# Patient Record
Sex: Female | Born: 1975
Health system: Southern US, Community
[De-identification: ages and names within clinical notes are randomized; demographics above are authoritative.]

## PROBLEM LIST (undated history)

## (undated) DIAGNOSIS — A048 Other specified bacterial intestinal infections: Secondary | ICD-10-CM

## (undated) HISTORY — DX: Other specified bacterial intestinal infections: A04.8

## (undated) HISTORY — PX: OTHER SURGICAL HISTORY: SHX169

## (undated) HISTORY — PX: INNER EAR SURGERY: SHX679

---

## 2018-06-03 DIAGNOSIS — A048 Other specified bacterial intestinal infections: Secondary | ICD-10-CM

## 2018-06-03 HISTORY — DX: Other specified bacterial intestinal infections: A04.8

## 2018-10-08 ENCOUNTER — Other Ambulatory Visit: Payer: Self-pay | Admitting: Physician Assistant

## 2018-10-08 ENCOUNTER — Other Ambulatory Visit: Payer: Self-pay

## 2018-10-08 ENCOUNTER — Ambulatory Visit (INDEPENDENT_AMBULATORY_CARE_PROVIDER_SITE_OTHER): Payer: PRIVATE HEALTH INSURANCE | Admitting: Physician Assistant

## 2018-10-08 ENCOUNTER — Encounter: Payer: Self-pay | Admitting: Physician Assistant

## 2018-10-08 VITALS — Temp 97.7°F | Wt 147.7 lb

## 2018-10-08 DIAGNOSIS — S63501A Unspecified sprain of right wrist, initial encounter: Secondary | ICD-10-CM | POA: Diagnosis not present

## 2018-10-08 DIAGNOSIS — G5601 Carpal tunnel syndrome, right upper limb: Secondary | ICD-10-CM | POA: Diagnosis not present

## 2018-10-08 MED ORDER — MELOXICAM 15 MG PO TABS: 15.0000 mg | ORAL_TABLET | Freq: Every day | ORAL | 0 refills | Status: DC

## 2018-10-08 NOTE — Progress Notes (Signed)
Virtual Visit via Video Note I connected with Melissa Newman on 10/08/18 at  1:00 PM EDT by a video enabled telemedicine application and verified that I am speaking with the correct person using two identifiers.   I discussed the limitations of evaluation and management by telemedicine and the availability of in person appointments. The patient expressed understanding and agreed to proceed.  History of Present Illness: Patient presents today via WebEx Visit to establish care and with acute concerns. Patient just moved from [place].  Patient endorses 2 months of R hand pain described as aching. Endorses symptoms began after having to pack up and haul moving boxes. Notes a lot of heavy lifting. Wakes up early in the morning from the pain. Notes occasional numbness/tingling in first three fingers of that hand. Denies decreased ROM. Some mild anterior writs pain noted. Denies bruising, redness. Has seen chiropractor for this and has had a few adjustments with some improvement. Denies imaging.   Observations/Objective: Patient is well-developed, well-nourished in no acute distress.  Resting comfortably in chair at home.  Head is normocephalic, atraumatic.  No labored breathing.  Speech is clear and coherent with logical contest.  Patient is alert and oriented at baseline.  + Tinel and Phalen sign on exam with help from patient and spouse.  ROM within normal limits.   Assessment and Plan: 1. Sprain of right wrist, initial encounter 2. Carpal tunnel syndrome, right BRACE given (picked up by husband). Wear nightly and when able during the day. Rx Meloxicam. Tylenol for breakthrough pain. Supportive measures reviewed. Follow-up if not improving over 1-2 weeks.   Follow Up Instructions: Follow-up via MyChart in 1-2 week for reassessment of symptoms.   I discussed the assessment and treatment plan with the patient. The patient was provided an opportunity to ask questions and all were answered. The  patient agreed with the plan and demonstrated an understanding of the instructions.   The patient was advised to call back or seek an in-person evaluation if the symptoms worsen or if the condition fails to improve as anticipated.  Piedad Climes, PA-C

## 2018-10-08 NOTE — Patient Instructions (Addendum)
Please avoid heavy lifting. Wear the brace at night and when possible during the day.  Elevate the wrist and arm while resting. Take the Meloxicam once daily with food. Tylenol for breakthrough pain.  Let me know if symptoms are not improving.    Wrist Sprain, Adult A wrist sprain is a stretch or tear in the strong, fibrous tissues (ligaments) that connect your wrist bones. There are three types of wrist sprains:  Grade 1. In this type of sprain, the ligament is stretched more than normal.  Grade 2. In this type of sprain, the ligament is partially torn. You may be able to move your wrist, but not very much.  Grade 3. In this type of sprain, the ligament or muscle is completely torn. You may find it difficult or extremely painful to move your wrist even a little. What are the causes? A wrist sprain can be caused by using the wrist too much during sports, exercise, or at work. It can also happen with a fall or during an accident. What increases the risk? This condition is more likely to occur in people:  With a previous wrist or arm injury.  With poor wrist strength and flexibility.  Who play contact sports, such as football or soccer.  Who play sports that may result in a fall, such as skateboarding, biking, skiing, or snowboarding.  Who do not exercise regularly.  Who use exercise equipment that does not fit well. What are the signs or symptoms? Symptoms of this condition include:  Pain in the wrist, arm, or hand.  Swelling or bruised skin near the wrist, hand, or arm. The skin may look yellow or kind of blue.  Stiffness or trouble moving the hand.  Hearing a pop or feeling a tear at the time of the injury.  A warm feeling in the skin around the wrist. How is this diagnosed? This condition is diagnosed with a physical exam. Sometimes an X-ray is taken to make sure a bone did not break. If your health care provider thinks that you tore a ligament, he or she may order an  MRI of your wrist. How is this treated? This condition is treated by resting and applying ice to your wrist. Additional treatment may include:  Medicine for pain and inflammation.  A splint to keep your wrist still (immobilized).  Exercises to strengthen and stretch your wrist.  Surgery. This may be done if the ligament is completely torn. Follow these instructions at home: If you have a splint:   Do not put pressure on any part of the splint until it is fully hardened. This may take several hours.  Wear the splint as told by your health care provider. Remove it only as told by your health care provider.  Loosen the splint if your fingers tingle, become numb, or turn cold and blue.  If your splint is not waterproof: ? Do not let it get wet. ? Cover it with a watertight covering when you take a bath or a shower.  Keep the splint clean. Managing pain, stiffness, and swelling   If directed, put ice on the injured area. ? If you have a removable splint, remove it as told by your health care provider. ? Put ice in a plastic bag. ? Place a towel between your skin and the bag or between the splint and the bag. ? Leave the ice on for 20 minutes, 2-3 times per day.  Move your fingers often to avoid stiffness and to  lessen swelling.  Raise (elevate) the injured area above the level of your heart while you are sitting or lying down. Activity  Rest your wrist. Do not do things that cause pain.  Return to your normal activities as told by your health care provider. Ask your health care provider what activities are safe for you.  Do exercises as told by your health care provider. General instructions  Take over-the-counter and prescription medicines only as told by your health care provider.  Do not use any products that contain nicotine or tobacco, such as cigarettes and e-cigarettes. These can delay healing. If you need help quitting, ask your health care provider.  Ask your  health care provider when it is safe to drive if you have a splint.  Keep all follow-up visits as told by your health care provider. This is important. Contact a health care provider if:  Your pain, bruising, or swelling gets worse.  Your skin becomes red, gets a rash, or has open sores.  Your pain does not get better or it gets worse. Get help right away if:  You have a new or sudden sharp pain in the hand, arm, or wrist.  You have tingling or numbness in your hand.  Your fingers turn white, very red, or cold and blue.  You cannot move your fingers. This information is not intended to replace advice given to you by your health care provider. Make sure you discuss any questions you have with your health care provider. Document Released: 02/21/2014 Document Revised: 01/16/2016 Document Reviewed: 01/07/2016 Elsevier Interactive Patient Education  2019 [employer] Inc[employer]   Carpal Tunnel Syndrome  Carpal tunnel syndrome is a condition that causes pain in your hand and arm. The carpal tunnel is a narrow area that is on the palm side of your wrist. Repeated wrist motion or certain diseases may cause swelling in the tunnel. This swelling can pinch the main nerve in the wrist (median nerve). What are the causes? This condition may be caused by:  Repeated wrist motions.  Wrist injuries.  Arthritis.  A sac of fluid (cyst) or abnormal growth (tumor) in the carpal tunnel.  Fluid buildup during pregnancy. Sometimes the cause is not known. What increases the risk? The following factors may make you more likely to develop this condition:  Having a job in which you move your wrist in the same way many times. This includes jobs like being a Midwifebutcher or a Conservation officer, naturecashier.  Being a woman.  Having other health conditions, such as: ? Diabetes. ? Obesity. ? A thyroid gland that is not active enough (hypothyroidism). ? Kidney failure. What are the signs or symptoms? Symptoms of this condition  include:  A tingling feeling in your fingers.  Tingling or a loss of feeling (numbness) in your hand.  Pain in your entire arm. This pain may get worse when you bend your wrist and elbow for a long time.  Pain in your wrist that goes up your arm to your shoulder.  Pain that goes down into your palm or fingers.  A weak feeling in your hands. You may find it hard to grab and hold items. You may feel worse at night. How is this diagnosed? This condition is diagnosed with a medical history and physical exam. You may also have tests, such as:  Electromyogram (EMG). This test checks the signals that the nerves send to the muscles.  Nerve conduction study. This test checks how well signals pass through your nerves.  Imaging  tests, such as X-rays, ultrasound, and MRI. These tests check for what might be the cause of your condition. How is this treated? This condition may be treated with:  Lifestyle changes. You will be asked to stop or change the activity that caused your problem.  Doing exercise and activities that make bones and muscles stronger (physical therapy).  Learning how to use your hand again (occupational therapy).  Medicines for pain and swelling (inflammation). You may have injections in your wrist.  A wrist splint.  Surgery. Follow these instructions at home: If you have a splint:  Wear the splint as told by your doctor. Remove it only as told by your doctor.  Loosen the splint if your fingers: ? Tingle. ? Lose feeling (become numb). ? Turn cold and blue.  Keep the splint clean.  If the splint is not waterproof: ? Do not let it get wet. ? Cover it with a watertight covering when you take a bath or a shower. Managing pain, stiffness, and swelling   If told, put ice on the painful area: ? If you have a removable splint, remove it as told by your doctor. ? Put ice in a plastic bag. ? Place a towel between your skin and the bag. ? Leave the ice on for 20  minutes, 2-3 times per day. General instructions  Take over-the-counter and prescription medicines only as told by your doctor.  Rest your wrist from any activity that may cause pain. If needed, talk with your boss at work about changes that can help your wrist heal.  Do any exercises as told by your doctor, physical therapist, or occupational therapist.  Keep all follow-up visits as told by your doctor. This is important. Contact a doctor if:  You have new symptoms.  Medicine does not help your pain.  Your symptoms get worse. Get help right away if:  You have very bad numbness or tingling in your wrist or hand. Summary  Carpal tunnel syndrome is a condition that causes pain in your hand and arm.  It is often caused by repeated wrist motions.  Lifestyle changes and medicines are used to treat this problem. Surgery may help in very bad cases.  Follow your doctor's instructions about wearing a splint, resting your wrist, keeping follow-up visits, and calling for help. This information is not intended to replace advice given to you by your health care provider. Make sure you discuss any questions you have with your health care provider. Document Released: 06/09/2011 Document Revised: 10/27/2017 Document Reviewed: 10/27/2017 Elsevier Interactive Patient Education  2019 [employer].

## 2018-10-08 NOTE — Progress Notes (Signed)
I have discussed the procedure for the virtual visit with the patient who has given consent to proceed with assessment and treatment.   Melissa Newman S Melissa Newman, CMA     

## 2018-10-27 ENCOUNTER — Encounter: Payer: Self-pay | Admitting: Physician Assistant

## 2018-11-09 ENCOUNTER — Encounter: Payer: Self-pay | Admitting: Physician Assistant

## 2018-11-09 ENCOUNTER — Ambulatory Visit (INDEPENDENT_AMBULATORY_CARE_PROVIDER_SITE_OTHER): Payer: PRIVATE HEALTH INSURANCE | Admitting: Physician Assistant

## 2018-11-09 ENCOUNTER — Other Ambulatory Visit: Payer: Self-pay

## 2018-11-09 DIAGNOSIS — L280 Lichen simplex chronicus: Secondary | ICD-10-CM

## 2018-11-09 DIAGNOSIS — B354 Tinea corporis: Secondary | ICD-10-CM

## 2018-11-09 MED ORDER — CLOTRIMAZOLE-BETAMETHASONE 1-0.05 % EX CREA: 1.0000 "application " | TOPICAL_CREAM | Freq: Two times a day (BID) | CUTANEOUS | 0 refills | Status: DC

## 2018-11-09 NOTE — Patient Instructions (Signed)
Please keep skin clean and dry. Apply a moisturizing lotion to areas twice daily (Eucerin, Cetaphil, etc). Use the prescription cream twice daily for 2 weeks. Follow-up with me at that time for reassessment (in-office). Return sooner if you note any worsening symptoms.    Neurodermatitis Neurodermatitis is an inflammation and thickening of the skin. It is caused by severe itchiness, which leads to repeated scratching and rubbing of the skin. It can happen anywhere on the body. Common places include the neck, head, arms, and legs. Neurodermatitis may have mental or emotional (psychogenic) causes that may need treatment. Usually, this is a lifelong (chronic) condition, but in some cases it may go away on its own. What are the causes? This condition is caused by repeated scratching and rubbing of the skin. This happens because of feelings of severe itchiness. Often, the cause of itchiness is not known. Common causes include:  Materials or substances that irritate the skin.  Skin conditions, such as chronic dermatitis or eczema.  Allergic reaction. In some cases, neurodermatitis may have psychogenic causes, like anxiety or stress, that may need to be treated. What increases the risk? You are more likely to develop this condition if:  You have dry skin or other skin conditions.  You have an anxiety disorder or a lot of stress.  You are 60-3 years old.  You are a woman.  You are exposed to irritating chemicals. What are the signs or symptoms? The main symptom of this condition is one or more patches of skin that are red, swollen, itchy, and thicker than normal.  These patches can be anywhere on the body, but are often on the head, neck, legs, and arms.  This condition can also affect the genital and anal areas.  In severe cases, bleeding, crusting, and scaling of the skin can occur. Symptoms often come and go over time. The frequency and severity of symptoms varies from person to  person. How is this diagnosed? This condition is diagnosed based on your medical history and a physical exam of your skin. You may be referred to a health care provider who specializes in skin care (dermatologist). You may also have tests, including:  Skin allergy tests. These tests will determine if you have an allergy that is causing your condition.  Blood or other lab tests. These tests can help to determine if your itching is caused by an infection or other condition. In some cases, your health care provider may remove a small amount of skin cells to be examined under a microscope (biopsy). How is this treated? This condition is managed by stopping all scratching and rubbing of the affected area. It is also important to remove all skin irritants and treat any underlying causes of neurodermatitis. Depending on the cause, your health care provider may recommend certain treatments such as:  Creams, lotions, or pills to reduce inflammation and itching (corticosteroids).  Medicines to prevent or treat infection (antibiotics).  Medicines to relieve allergy symptoms (antihistamines).  Therapy to learn how to stop feelings of itchiness and stop scratching (behavioral therapy or psychotherapy). Your health care provider may recommend a doctor who specializes in human behavior (psychologist).  Phototherapy. This condition sometimes goes away without treatment. Follow these instructions at home: Skin care   Avoid scratching and rubbing your skin. This is the best way to manage neurodermatitis and prevent it from returning.  Keep your skin clean and moisturized. ? Avoid very hot water. ? Apply lotion at least one time per day. ?  Avoid products, such as soaps and lotions, that have harsh chemicals, scents, and dyes. ? Try to shower and take baths only as often as you need to. Frequent bathing can dry out your skin.  Avoid tight or rough clothing that irritates your skin.  Apply cool  washcloths to your skin to help reduce itching. General instructions  Use over-the-counter and prescription medicines only as told by your health care provider.  Drink enough fluid to keep your urine pale yellow.  Avoid irritating chemicals.  Pay attention to your symptoms. Watch for things that trigger itching and scratching.  Keep your fingernails cut short to reduce injury from scratching.  Keep all follow-up visits as told by your health care provider. This is important. Contact a health care provider if:  Your condition gets worse or does not get better after 3-4 days of treatment.  You have blood or fluid leaking from an irritated patch of skin.  You have a fever. Summary  Neurodermatitis is an inflammation and thickening of the skin. It is caused by severe itchiness, which leads to repeated scratching and rubbing of the skin.  Depending on the cause, your health care provider may recommend certain treatments such as creams, lotions, and pills to reduce inflammation and other medicines to treat infection, if needed.  Treatment may also include therapy to learn how to stop feelings of itchiness and stop scratching (behavioral therapy or psychotherapy).  Use over-the-counter and prescription medicines only as told by your health care provider.  Keep all follow-up visits as told by your health care provider. This is important. This information is not intended to replace advice given to you by your health care provider. Make sure you discuss any questions you have with your health care provider. Document Released: 07/28/2004 Document Revised: 11/08/2017 Document Reviewed: 11/08/2017 Elsevier Interactive Patient Education  2019 [employer]

## 2018-11-09 NOTE — Progress Notes (Signed)
I have discussed the procedure for the virtual visit with the patient who has given consent to proceed with assessment and treatment.   Britny Riel S Luciano Cinquemani, CMA     

## 2018-11-09 NOTE — Progress Notes (Signed)
   Virtual Visit via Video   I connected with patient on 11/09/18 at  2:40 PM EDT by a video enabled telemedicine application and verified that I am speaking with the correct person using two identifiers.  Location patient: Home Location provider: [facility] Persons participating in the virtual visit: Patient, Provider, CMA (Patina Moore)  I discussed the limitations of evaluation and management by telemedicine and the availability of in person appointments. The patient expressed understanding and agreed to proceed.  Subjective:   HPI:   Patient presents via [facility] today c/o pruritic lesions of arms and neck over the past few months. Denies recent travel or sick contact. Denies dry skin or hx of eczema. Notes the area on her neck is extremely itchy, sometimes waking her from sleep. Topical hydrocortisone cream helps but the area comes back within a few days.  ROS:   See pertinent positives and negatives per HPI.  There are no active problems to display for this patient.   Social History   Tobacco Use  . Smoking status: Never Smoker  . Smokeless tobacco: Never Used  Substance Use Topics  . Alcohol use: Never    Frequency: Never    Current Outpatient Medications:  Marland Kitchen  Multiple Vitamins-Minerals (MULTIVITAMIN WITH MINERALS) tablet, Take 1 tablet by mouth daily., Disp: , Rfl:  .  clotrimazole-betamethasone (LOTRISONE) cream, Apply 1 application topically 2 (two) times daily., Disp: 30 g, Rfl: 0  No Known Allergies  Objective:   There were no vitals taken for this visit.  Patient is well-developed, well-nourished in no acute distress.  Resting comfortably at home.  Head is normocephalic, atraumatic.  No labored breathing.  Speech is clear and coherent with logical content.  Patient is alert and oriented at baseline.  Patient with hyperpigmented, slightly erythematous annular lesion of antecubital fossa of R arm with scaling. Similar lesions of R neck  noted with overlying lichenified hyperpigmented skin. This area is very pruritic per patient.   Assessment and Plan:   1. Tinea corporis 2. Lichen simplex chronicus Mild area of tinea corporis on the arm and likely on the neck as well. Neck lesion is covered by patch of what appears to be lichen simplex chronicus. Will start Lotrisone cream twice daily x 2 weeks to areas of concern. Supportive measures reviewed. Follow-up in office in 2 weeks for reassessment if not resolved.    Piedad Climes, PA-C 11/09/2018

## 2018-12-04 ENCOUNTER — Other Ambulatory Visit: Payer: Self-pay | Admitting: Physician Assistant

## 2018-12-04 DIAGNOSIS — G5601 Carpal tunnel syndrome, right upper limb: Secondary | ICD-10-CM

## 2018-12-04 DIAGNOSIS — G8929 Other chronic pain: Secondary | ICD-10-CM

## 2018-12-21 ENCOUNTER — Encounter: Payer: Self-pay | Admitting: Physician Assistant

## 2018-12-23 NOTE — Telephone Encounter (Signed)
Please call patient to schedule nurse visit for 2nd MMR and Varicella. Thank you.

## 2018-12-24 ENCOUNTER — Ambulatory Visit (INDEPENDENT_AMBULATORY_CARE_PROVIDER_SITE_OTHER): Payer: PRIVATE HEALTH INSURANCE

## 2018-12-24 ENCOUNTER — Ambulatory Visit: Payer: PRIVATE HEALTH INSURANCE

## 2018-12-24 ENCOUNTER — Other Ambulatory Visit: Payer: Self-pay

## 2018-12-24 DIAGNOSIS — Z23 Encounter for immunization: Secondary | ICD-10-CM | POA: Diagnosis not present

## 2018-12-25 ENCOUNTER — Ambulatory Visit: Payer: PRIVATE HEALTH INSURANCE

## 2019-02-08 ENCOUNTER — Other Ambulatory Visit: Payer: Self-pay

## 2019-02-08 ENCOUNTER — Encounter: Payer: Self-pay | Admitting: Physician Assistant

## 2019-02-08 ENCOUNTER — Ambulatory Visit (INDEPENDENT_AMBULATORY_CARE_PROVIDER_SITE_OTHER): Payer: PRIVATE HEALTH INSURANCE | Admitting: Physician Assistant

## 2019-02-08 VITALS — BP 100/60 | HR 80 | Temp 98.4°F | Resp 14 | Ht 64.0 in | Wt 146.0 lb

## 2019-02-08 DIAGNOSIS — R350 Frequency of micturition: Secondary | ICD-10-CM | POA: Diagnosis not present

## 2019-02-08 DIAGNOSIS — N3001 Acute cystitis with hematuria: Secondary | ICD-10-CM

## 2019-02-08 LAB — POCT URINALYSIS DIPSTICK
Bilirubin, UA: NEGATIVE
Glucose, UA: NEGATIVE
Ketones, UA: NEGATIVE
Nitrite, UA: NEGATIVE
Protein, UA: POSITIVE — AB
Spec Grav, UA: 1.01 (ref 1.010–1.025)
Urobilinogen, UA: 0.2 E.U./dL
pH, UA: 7 (ref 5.0–8.0)

## 2019-02-08 MED ORDER — CEPHALEXIN 500 MG PO CAPS: 500.0000 mg | ORAL_CAPSULE | Freq: Two times a day (BID) | ORAL | 0 refills | Status: DC

## 2019-02-08 NOTE — Patient Instructions (Signed)
Your symptoms are consistent with a bladder infection, also called acute cystitis. Please take your antibiotic (Keflex) as directed until all pills are gone.  Stay very well hydrated.  Consider a daily probiotic (Align, Culturelle, or Activia) to help prevent stomach upset caused by the antibiotic.  Taking a probiotic daily may also help prevent recurrent UTIs.  Also consider taking AZO (Phenazopyridine) tablets to help decrease pain with urination.  I will call you with your urine testing results.  We will change antibiotics if indicated.  Call or return to clinic if symptoms are not resolved by completion of antibiotic.   Urinary Tract Infection A urinary tract infection (UTI) can occur any place along the urinary tract. The tract includes the kidneys, ureters, bladder, and urethra. A type of germ called bacteria often causes a UTI. UTIs are often helped with antibiotic medicine.  HOME CARE   If given, take antibiotics as told by your doctor. Finish them even if you start to feel better.  Drink enough fluids to keep your pee (urine) clear or pale yellow.  Avoid tea, drinks with caffeine, and bubbly (carbonated) drinks.  Pee often. Avoid holding your pee in for a long time.  Pee before and after having sex (intercourse).  Wipe from front to back after you poop (bowel movement) if you are a woman. Use each tissue only once. GET HELP RIGHT AWAY IF:   You have back pain.  You have lower belly (abdominal) pain.  You have chills.  You feel sick to your stomach (nauseous).  You throw up (vomit).  Your burning or discomfort with peeing does not go away.  You have a fever.  Your symptoms are not better in 3 days. MAKE SURE YOU:   Understand these instructions.  Will watch your condition.  Will get help right away if you are not doing well or get worse. Document Released: 12/07/2007 Document Revised: 03/14/2012 Document Reviewed: 01/19/2012 Exit[employer] Patient Information 2015  [employer]. This information is not intended to replace advice given to you by your health care provider. Make sure you discuss any questions you have with your health care provider.   

## 2019-02-08 NOTE — Progress Notes (Signed)
BVQ:XIHWTU, Melissa Cole, PA-C Chief Complaint  Patient presents with  . Urinary Tract Infection    Started 1 day ago. Urgency, low back pain, hematuria, dysuria. Increased fluids, cranberry juice, and Tylenol for pain.     Current Issues:  Presents with 1 days of dysuria, urinary urgency and urinary frequency Associated symptoms include:  urinary hesitancy and hematuria, low back pain. Denies flank pain, fever, chills, nausea or vomiting. LMP was 2 weeks ago. Denies vaginal symptoms..  There is a previous history of of similar symptoms. Sexually active:  Yes with female.  - husband No concern for STI.  Prior to Admission medications   Medication Sig Start Date End Date Taking? Authorizing Provider  Multiple Vitamins-Minerals (MULTIVITAMIN WITH MINERALS) tablet Take 1 tablet by mouth daily.   Yes [provider]    Review of Systems:Pertinent ROS are listed in the HPI  PE:  BP 100/60   Pulse 80   Temp 98.4 F (36.9 C) (Skin)   Resp 14   Ht 5\' 4"  (1.626 m)   Wt 146 lb (66.2 kg)   SpO2 98%   BMI 25.06 kg/m    Physical Exam  Constitutional: She is oriented to person, place, and time and well-developed, well-nourished, and in no distress.  HENT:  Head: Normocephalic and atraumatic.  Eyes: Conjunctivae are normal.  Neck: Neck supple.  Cardiovascular: Normal rate, regular rhythm, normal heart sounds and intact distal pulses.  Pulmonary/Chest: Effort normal and breath sounds normal. No respiratory distress. She has no wheezes. She has no rales. She exhibits no tenderness.  Abdominal: There is no abdominal tenderness. There is no CVA tenderness.  Neurological: She is alert and oriented to person, place, and time.  Psychiatric: Affect normal.  Vitals reviewed.  No results found for this or any previous visit.  Assessment and Plan:  1. Urinary frequency Urine dip + blood and large LE. Classic UTI symptoms. Will start Keflex empirically. Urine culture sent. Supportive  measures and OTC medications reviewed with patient. Will alter treatment based on culture sensitivities if change is indicated.  - POCT Urinalysis Dipstick

## 2019-02-10 LAB — URINE CULTURE
MICRO NUMBER:: 749235
SPECIMEN QUALITY:: ADEQUATE

## 2019-02-11 ENCOUNTER — Encounter: Payer: Self-pay | Admitting: Physician Assistant

## 2019-02-14 ENCOUNTER — Telehealth: Payer: Self-pay | Admitting: Emergency Medicine

## 2019-02-14 ENCOUNTER — Other Ambulatory Visit: Payer: Self-pay | Admitting: Physician Assistant

## 2019-02-14 DIAGNOSIS — N3001 Acute cystitis with hematuria: Secondary | ICD-10-CM

## 2019-02-14 DIAGNOSIS — N949 Unspecified condition associated with female genital organs and menstrual cycle: Secondary | ICD-10-CM

## 2019-02-14 MED ORDER — NITROFURANTOIN MONOHYD MACRO 100 MG PO CAPS: 100.0000 mg | ORAL_CAPSULE | Freq: Two times a day (BID) | ORAL | 0 refills | Status: DC

## 2019-02-14 NOTE — Telephone Encounter (Signed)
Advised patient on the phone. She is finishing up her Keflex today. Patient states her symptoms are worst. She is uncomfortable and wants a referral to GYN. PCP agreeable. Referral placed.   No need for repeat appointment. Reviewed culture results. Stop Keflex. Start Macrobid 100 mg BID x 5 days. I have sent in Rx.   ----- Message -----  From: Leonidas Romberg, CMA  Sent: 02/14/2019 11:26 AM EDT  To: [name], PA-C  Subject: FW: Appointment Request                 ----- Message -----  From: Wynell Balloon  Sent: 02/14/2019  7:51 AM EDT  To: Leonidas Romberg, CMA  Subject: FW: Appointment Request               Please advise if pt needs to come in for another appt? Pt is still complaining of UTI symptoms.   ----- Message -----  From: Melissa Newman  Sent: 02/13/2019 10:01 PM EDT  To: [facility]  Subject: Appointment Request                 Appointment Request From: Melissa Newman    With Provider: Caralyn Guile [facility]]    Preferred Date Range: 02/15/2019 - 02/15/2019    Preferred Times: Friday Afternoon    Reason for visit: Request an Appointment    Comments:  Same as last appointment

## 2019-02-22 ENCOUNTER — Other Ambulatory Visit: Payer: Self-pay

## 2019-02-25 ENCOUNTER — Ambulatory Visit (INDEPENDENT_AMBULATORY_CARE_PROVIDER_SITE_OTHER): Payer: PRIVATE HEALTH INSURANCE | Admitting: Obstetrics & Gynecology

## 2019-02-25 ENCOUNTER — Encounter: Payer: Self-pay | Admitting: Obstetrics & Gynecology

## 2019-02-25 ENCOUNTER — Other Ambulatory Visit: Payer: Self-pay

## 2019-02-25 VITALS — BP 122/78 | Ht 65.0 in | Wt 152.0 lb

## 2019-02-25 DIAGNOSIS — Z01419 Encounter for gynecological examination (general) (routine) without abnormal findings: Secondary | ICD-10-CM

## 2019-02-25 DIAGNOSIS — Z9189 Other specified personal risk factors, not elsewhere classified: Secondary | ICD-10-CM | POA: Diagnosis not present

## 2019-02-25 DIAGNOSIS — Z1151 Encounter for screening for human papillomavirus (HPV): Secondary | ICD-10-CM

## 2019-02-25 DIAGNOSIS — Z113 Encounter for screening for infections with a predominantly sexual mode of transmission: Secondary | ICD-10-CM | POA: Diagnosis not present

## 2019-02-25 NOTE — Progress Notes (Signed)
Brandon Melnickrene Arnell September 21, 1975 [id number]   History:    43 y.o. G1P1L1  Married.  Vasectomy.  Son is 734 yo.  RP:  New patient presenting for annual gyn exam   HPI: Normal menstrual periods every month.  No breakthrough bleeding.  No pelvic pain.  No pain with intercourse.  Husband with vasectomy.  Breasts normal.  Body mass index 25.29.  Patient just resumed physical activities.  Health labs with family physician.  Past medical history,surgical history, family history and social history were all reviewed and documented in the EPIC chart.  Gynecologic History Patient's last menstrual period was 01/27/2019. Contraception: vasectomy Last Pap: 03/2018. Results were: normal per patient Last mammogram: Never, [facility] Bone Density: Never Colonoscopy: Never  Obstetric History OB History  Gravida Para Term Preterm AB Living  1 1       1   SAB TAB Ectopic Multiple Live Births               # Outcome Date GA Lbr Len/2nd Weight Sex Delivery Anes PTL Lv  1 Para              ROS: A ROS was performed and pertinent positives and negatives are included in the history.  GENERAL: No fevers or chills. HEENT: No change in vision, no earache, sore throat or sinus congestion. NECK: No pain or stiffness. CARDIOVASCULAR: No chest pain or pressure. No palpitations. PULMONARY: No shortness of breath, cough or wheeze. GASTROINTESTINAL: No abdominal pain, nausea, vomiting or diarrhea, melena or bright red blood per rectum. GENITOURINARY: No urinary frequency, urgency, hesitancy or dysuria. MUSCULOSKELETAL: No joint or muscle pain, no back pain, no recent trauma. DERMATOLOGIC: No rash, no itching, no lesions. ENDOCRINE: No polyuria, polydipsia, no heat or cold intolerance. No recent change in weight. HEMATOLOGICAL: No anemia or easy bruising or bleeding. NEUROLOGIC: No headache, seizures, numbness, tingling or weakness. PSYCHIATRIC: No depression, no loss of interest in normal activity  or change in sleep pattern.     Exam:   BP 122/78   Ht 5\' 5"  (1.651 m)   Wt 152 lb (68.9 kg)   LMP 01/27/2019   BMI 25.29 kg/m   Body mass index is 25.29 kg/m.  General appearance : Well developed well nourished female. No acute distress HEENT: Eyes: no retinal hemorrhage or exudates,  Neck supple, trachea midline, no carotid bruits, no thyroidmegaly Lungs: Clear to auscultation, no rhonchi or wheezes, or rib retractions  Heart: Regular rate and rhythm, no murmurs or gallops Breast:Examined in sitting and supine position were symmetrical in appearance, no palpable masses or tenderness,  no skin retraction, no nipple inversion, no nipple discharge, no skin discoloration, no axillary or supraclavicular lymphadenopathy Abdomen: no palpable masses or tenderness, no rebound or guarding Extremities: no edema or skin discoloration or tenderness  Pelvic: Vulva: Normal             Vagina: No gross lesions or discharge  Cervix: No gross lesions or discharge.  Pap/HPV HR, Gono-Chlam done.  Uterus  AV, normal size, shape and consistency, non-tender and mobile  Adnexa  Without masses or tenderness  Anus: Normal   Assessment/Plan:  43 y.o. female for annual exam   1. Encounter for routine gynecological examination with Papanicolaou smear of cervix Normal gynecologic exam.  Pap with high-risk HPV done today.  Breast exam normal.  Will schedule screening mammogram at the breast center now.  Health labs with family physician.  Good body mass index  at 25.29.  Recommend aerobic physical activities 5 times a week and weightlifting every 2 days.  Continue with healthy nutrition.  2. Relies on partner vasectomy for contraception  3. Screen for STD (sexually transmitted disease) - HIV antibody (with reflex) - RPR - Hepatitis C Antibody - Hepatitis B Surface AntiGEN -Gonorrhea and chlamydia on Pap  Other orders - Probiotic Product (PROBIOTIC-10 PO); Take by mouth. - diphenhydrAMINE (BENADRYL)  25 mg capsule; Take 25 mg by mouth every 6 (six) hours as needed.  Princess Bruins MD, 9:42 AM 02/25/2019

## 2019-02-25 NOTE — Patient Instructions (Signed)
1. Encounter for routine gynecological examination with Papanicolaou smear of cervix Normal gynecologic exam.  Pap with high-risk HPV done today.  Breast exam normal.  Will schedule screening mammogram at the breast center now.  Health labs with family physician.  Good body mass index at 25.29.  Recommend aerobic physical activities 5 times a week and weightlifting every 2 days.  Continue with healthy nutrition.  2. Relies on partner vasectomy for contraception  3. Screen for STD (sexually transmitted disease) - HIV antibody (with reflex) - RPR - Hepatitis C Antibody - Hepatitis B Surface AntiGEN -Gonorrhea and chlamydia on Pap  Other orders - Probiotic Product (PROBIOTIC-10 PO); Take by mouth. - diphenhydrAMINE (BENADRYL) 25 mg capsule; Take 25 mg by mouth every 6 (six) hours as needed.  Melissa Newman, it was a pleasure seeing you today!  I will inform you of your results as soon as they are available.

## 2019-02-25 NOTE — Addendum Note (Signed)
Addended by: Thurnell Garbe A on: 02/25/2019 11:26 AM   Modules accepted: Orders

## 2019-02-26 LAB — PAP IG, CT-NG NAA, HPV HIGH-RISK
C. trachomatis RNA, TMA: NOT DETECTED
HPV DNA High Risk: NOT DETECTED
N. gonorrhoeae RNA, TMA: NOT DETECTED

## 2019-02-27 LAB — HEPATITIS C ANTIBODY
Hepatitis C Ab: NONREACTIVE
SIGNAL TO CUT-OFF: 0.06 (ref <1.00)

## 2019-02-27 LAB — FLUORESCENT TREPONEMAL AB(FTA)-IGG-BLD: Fluorescent Treponemal ABS: NONREACTIVE

## 2019-02-27 LAB — HEPATITIS B SURFACE ANTIGEN: Hepatitis B Surface Ag: NONREACTIVE

## 2019-02-27 LAB — RPR TITER: RPR Titer: 1:1 {titer} — ABNORMAL HIGH

## 2019-02-27 LAB — HIV ANTIBODY (ROUTINE TESTING W REFLEX): HIV 1&2 Ab, 4th Generation: NONREACTIVE

## 2019-02-27 LAB — RPR: RPR Ser Ql: REACTIVE — AB

## 2019-03-13 ENCOUNTER — Other Ambulatory Visit: Payer: Self-pay

## 2019-03-13 ENCOUNTER — Ambulatory Visit (INDEPENDENT_AMBULATORY_CARE_PROVIDER_SITE_OTHER): Payer: PRIVATE HEALTH INSURANCE

## 2019-03-13 ENCOUNTER — Encounter: Payer: Self-pay | Admitting: Physician Assistant

## 2019-03-13 DIAGNOSIS — Z23 Encounter for immunization: Secondary | ICD-10-CM | POA: Diagnosis not present

## 2019-04-03 ENCOUNTER — Ambulatory Visit: Payer: PRIVATE HEALTH INSURANCE | Admitting: Physician Assistant

## 2019-11-28 ENCOUNTER — Ambulatory Visit (INDEPENDENT_AMBULATORY_CARE_PROVIDER_SITE_OTHER): Payer: 59 | Admitting: Family Medicine

## 2019-11-28 ENCOUNTER — Encounter: Payer: Self-pay | Admitting: Family Medicine

## 2019-11-28 ENCOUNTER — Other Ambulatory Visit: Payer: Self-pay

## 2019-11-28 VITALS — BP 116/68 | HR 76 | Temp 98.0°F | Resp 16 | Ht 65.0 in | Wt 143.2 lb

## 2019-11-28 DIAGNOSIS — M0609 Rheumatoid arthritis without rheumatoid factor, multiple sites: Secondary | ICD-10-CM

## 2019-11-28 MED ORDER — PREDNISONE 10 MG PO TABS
ORAL_TABLET | ORAL | 0 refills | Status: DC
Start: 2019-11-28 — End: 2020-04-16

## 2019-11-28 NOTE — Progress Notes (Signed)
   Subjective:    Patient ID: Melissa Newman, female    DOB: October 24, 1975, 44 y.o.   MRN: [id number]  HPI Hand and shoulder pain- R wrist pain, L 4th finger PIP joint swelling.  Went to Ortho last year and was injected w/ steroid which helped for ~2 weeks.  Did PT w/o relief.  January 2021 went to Rheumatology and was told she had RA and possibly psoriatic arthritis.  Was told to take Methotrexate but she did not.  Rheumatology wanted her to f/u in 6 weeks but she did not.  She decided to change diet to whole food plant based diet.  Is taking Glucosamine w/ some relief.  R shoulder pain new as of 3 weeks ago.  No known injury.  No change in activity level.  Husband reports they have 'done a lot of research' and didn't feel that a 'temporary fix' was the right approach.   Review of Systems For ROS see HPI   This visit occurred during the SARS-CoV-2 public health emergency.  Safety protocols were in place, including screening questions prior to the visit, additional usage of staff PPE, and extensive cleaning of exam room while observing appropriate contact time as indicated for disinfecting solutions.       Objective:   Physical Exam Vitals reviewed.  Constitutional:      General: She is not in acute distress.    Appearance: Normal appearance. She is not ill-appearing.  HENT:     Head: Normocephalic and atraumatic.  Musculoskeletal:        General: Deformity (L 4th finger PIP joint deformity, R wrist fixed deformity) present.     Comments: No obvious deformity of R shoulder  Neurological:     General: No focal deficit present.     Mental Status: She is alert and oriented to person, place, and time.  Psychiatric:        Mood and Affect: Mood normal.        Behavior: Behavior normal.           Assessment & Plan:  Rheumatoid Arthritis- New.  Had long discussion w/ both pt and husband that while a plant based, whole food diet was great, it was not going to cure or prevent the  progression of RA.  Explained that RA is an autoimmune process and that her body would continue to attack her joints, with worsening pain, limited mobility, and eventual joint destruction.  I told them that medication was not a 'temporary fix' but rather used to treat inflammation and suppress the immune system attack on the joints.  Discussed that Prednisone was used as a temporary fix or bridge to improve initial pain and inflammation while initiating treatment.  Will start Pred taper and refer back to Rheumatology as pt reports she would like to start over w/ new Rheumatologist.

## 2019-11-28 NOTE — Patient Instructions (Signed)
Follow up with Melissa Newman as needed or as scheduled We'll call you with your Rheumatology appt START the Prednisone as directed- 30mg  x3 days, then 20mg  x3 days, and then 10mg  daily.  Take w/ food Call with any questions or concerns Hang in there!

## 2020-02-02 HISTORY — PX: FINGER SURGERY: SHX640

## 2020-04-10 NOTE — Progress Notes (Signed)
Office Visit Note  Patient: Melissa Newman             Date of Birth: Mar 23, 1976           MRN: [id number]             PCP: [name], PA-C Referring: Midge Minium, MD Visit Date: 04/23/2020 Occupation: '@GUAROCC' @  Subjective:  Right wrist swelling.   History of Present Illness: Melissa Newman is a 44 y.o. female originally from [place].  She had been living at [place] for 13 years and moved to [place] in January 2020.  She states she was staying in a hotel and [place] and noticed that her right wrist joint was swollen.  She went to 2 different chiropractors and had about 10 sessions without any improvement.  She also did not have any insurance at that time.  Now she is living in [facility] and has insurance.  She was seen by her PCP and was referred to [facility] rheumatology where she had thorough work-up and was advised to start on immunosuppressive agents.  Patient did not like the idea of immunosuppression and has done some dietary modifications.  She was also prescribed prednisone by Dr. Birdie Riddle but she did not take it.  She has been on gluten-free and dairy free diet.  She is also taking limited meat.  She is mostly on plant-based diet.  She has noticed some improvement in her symptoms.  She states she continues to have pain and swelling in her right wrist joint.  She also had recent surgery for left fourth flexor tenosynovitis.  She complains of discomfort in her right shoulder.  She denies discomfort in any of her other joints.  There is no family history of rheumatoid arthritis or any other autoimmune diseases.  Activities of Daily Living:  Patient reports morning stiffness for 0  minutes.   Patient Reports nocturnal pain.  Difficulty dressing/grooming: Denies Difficulty climbing stairs: Denies Difficulty getting out of chair: Denies Difficulty using hands for taps, buttons, cutlery, and/or writing: Reports  Review of Systems    Constitutional: Positive for fatigue.  HENT: Negative for mouth sores, mouth dryness and nose dryness.   Eyes: Positive for dryness. Negative for pain and itching.  Respiratory: Negative for shortness of breath and difficulty breathing.   Cardiovascular: Negative for chest pain and palpitations.  Gastrointestinal: Negative for blood in stool, constipation and diarrhea.  Endocrine: Negative for increased urination.  Genitourinary: Negative for difficulty urinating.  Musculoskeletal: Positive for arthralgias, joint pain, joint swelling and muscle weakness. Negative for myalgias, morning stiffness, muscle tenderness and myalgias.  Skin: Negative for color change, rash and redness.  Allergic/Immunologic: Negative for susceptible to infections.  Neurological: Positive for numbness. Negative for dizziness, headaches, memory loss and weakness.  Hematological: Negative for bruising/bleeding tendency.  Psychiatric/Behavioral: Positive for sleep disturbance. Negative for confusion.    PMFS History:  There are no problems to display for this patient.   Past Medical History:  Diagnosis Date  . H. pylori infection 06/2018   s/p treatment -- no residual infection    Family History  Problem Relation Age of Onset  . Early death Mother   . COPD Mother   . COPD Father   . Hyperlipidemia Father   . Alcohol abuse Father   . Stroke Father   . Breast cancer Maternal Aunt 60  . Hyperlipidemia Sister   . Alcohol abuse Brother   . Healthy Son    Past Surgical  History:  Procedure Laterality Date  . CESAREAN SECTION    . FINGER SURGERY  02/2020  . INNER EAR SURGERY    . tympanostomy Left    Social History   Social History Narrative  . Not on file   Immunization History  Administered Date(s) Administered  . Influenza,inj,Quad PF,6+ Mos 03/13/2019  . MMR 12/24/2018  . Varicella 12/24/2018     Objective: Vital Signs: BP 105/71 (BP Location: Right Arm, Patient Position: Sitting, Cuff  Size: Normal)   Pulse 69   Resp 14   Ht 5' 5.75" (1.67 m)   Wt 138 lb (62.6 kg)   LMP 03/28/2020 Comment: NO BIRTH CONTROL   BMI 22.44 kg/m    Physical Exam Vitals and nursing note reviewed.  Constitutional:      Appearance: She is well-developed.  HENT:     Head: Normocephalic and atraumatic.  Eyes:     Conjunctiva/sclera: Conjunctivae normal.  Cardiovascular:     Rate and Rhythm: Normal rate and regular rhythm.     Heart sounds: Normal heart sounds.  Pulmonary:     Effort: Pulmonary effort is normal.     Breath sounds: Normal breath sounds.  Abdominal:     General: Bowel sounds are normal.     Palpations: Abdomen is soft.  Musculoskeletal:     Cervical back: Normal range of motion.  Lymphadenopathy:     Cervical: No cervical adenopathy.  Skin:    General: Skin is warm and dry.     Capillary Refill: Capillary refill takes less than 2 seconds.  Neurological:     Mental Status: She is alert and oriented to person, place, and time.  Psychiatric:        Behavior: Behavior normal.      Musculoskeletal Exam: C-spine, thoracic and lumbar spine were in good range of motion.  She had painful limited range of motion of her right shoulder joint, left shoulder joint was in full range of motion.  Elbow joints were in good range of motion.  She is unable to supinate her right hand.  She has severe synovitis over right wrist joint.  She had recent surgery for left fourth tenosynovitis.  She also had synovitis of her right second and third MCP joints.  CDAI Exam: CDAI Score: 10.4  Patient Global: 7 mm; Provider Global: 7 mm Swollen: 4 ; Tender: 5  Joint Exam 04/23/2020      Right  Left  Glenohumeral   Tender     Wrist  Swollen Tender     MCP 2  Swollen Tender     MCP 3  Swollen Tender     PIP 4     Swollen Tender     Investigation: No additional findings.  Imaging: No results found.  Recent Labs: Lab Results  Component Value Date   WBC 5.4 04/20/2020   HGB 13.3  04/20/2020   PLT 292 04/20/2020   NA 139 04/20/2020   K 4.0 04/20/2020   CL 104 04/20/2020   CO2 26 04/20/2020   GLUCOSE 85 04/20/2020   BUN 5 (L) 04/20/2020   CREATININE 0.51 04/20/2020   BILITOT 0.8 04/20/2020   AST 13 04/20/2020   ALT 8 04/20/2020   PROT 7.1 04/20/2020   CALCIUM 9.2 04/20/2020  February 25, 2019 hepatitis B-, hepatitis C negative, HIV negative April 20, 2020 vitamin D 24, TSH 1.2  Speciality Comments: No specialty comments available.  Procedures:  No procedures performed Allergies: Contrast media [iodinated diagnostic agents]  Assessment / Plan:     Visit Diagnoses: Chronic inflammatory arthritis-patient has persistent inflammatory arthritis for the last 1 year.  She has pain and swelling and multiple joints involving her right shoulder, right wrist joint has severe synovitis.  She also has involvement of right second and third MCP joint.  She had recent left fourth flexor tenosynovectomy.  I do not have labs available.  Based on the clinical examination I believe she has rheumatoid arthritis.  Detailed counseling on rheumatoid arthritis was provided.  Patient was seen previously at [facility] rheumatology and was discussed immunosuppressive therapy she did not like the idea of taking any medications.  She did not even take prednisone.  She has done dietary modifications.  She is also planning pregnancy.  I detailed discussion regarding most likely diagnosis of rheumatoid arthritis.  Immunosuppressive therapy was discussed.  I also discussed prednisone taper.  Patient would like to hold off prednisone taper at this point.  Have given her a handout on Cimzia to review.  High risk medication use-I will obtain labs today to evaluate for any underlying autoimmune or inflammatory process.  Chronic right shoulder pain -she did not complain about her right shoulder joint initially but had very limited range of motion of her right shoulder.  Plan: XR Shoulder Right  Pain in  both hands -she has severe synovitis and effusion in her right wrist joint.  She has right second and third MCP swelling.  She had left fourth flexor tendon tenosynovectomy.  Plan: XR Hand 2 View Right, XR Hand 2 View Left.  X-rays were consistent with severe erosive rheumatoid arthritis.  Vitamin D deficiency-she was recently diagnosed with vitamin D deficiency.  Orders: Orders Placed This Encounter  Procedures  . XR Shoulder Right  . XR Hand 2 View Right  . XR Hand 2 View Left  . DG Chest 2 View  . Urinalysis, Routine w reflex microscopic  . Sedimentation rate  . Uric acid  . Rheumatoid factor  . Cyclic citrul peptide antibody, IgG  . 14-3-3 eta Protein  . ANA  . QuantiFERON-TB Gold Plus  . Serum protein electrophoresis with reflex  . IgG, IgA, IgM  . Glucose 6 phosphate dehydrogenase   No orders of the defined types were placed in this encounter.     Follow-Up Instructions: Return for Inflammatory arthritis.   Bo Merino, MD  Note - This record has been created using Editor, commissioning.  Chart creation errors have been sought, but may not always  have been located. Such creation errors do not reflect on  the standard of medical care.

## 2020-04-16 ENCOUNTER — Other Ambulatory Visit: Payer: Self-pay | Admitting: Obstetrics & Gynecology

## 2020-04-16 ENCOUNTER — Encounter: Payer: Self-pay | Admitting: Obstetrics & Gynecology

## 2020-04-16 ENCOUNTER — Ambulatory Visit (INDEPENDENT_AMBULATORY_CARE_PROVIDER_SITE_OTHER): Payer: 59 | Admitting: Obstetrics & Gynecology

## 2020-04-16 ENCOUNTER — Other Ambulatory Visit: Payer: Self-pay

## 2020-04-16 VITALS — BP 120/78 | Ht 64.5 in | Wt 144.0 lb

## 2020-04-16 DIAGNOSIS — Z1231 Encounter for screening mammogram for malignant neoplasm of breast: Secondary | ICD-10-CM

## 2020-04-16 DIAGNOSIS — Z9189 Other specified personal risk factors, not elsewhere classified: Secondary | ICD-10-CM | POA: Diagnosis not present

## 2020-04-16 DIAGNOSIS — Z01419 Encounter for gynecological examination (general) (routine) without abnormal findings: Secondary | ICD-10-CM | POA: Diagnosis not present

## 2020-04-16 NOTE — Progress Notes (Signed)
Melissa Newman 1976-01-18 [id number]   History:    44 y.o. G1P1L1  Married.  Vasectomy.  Son is 42 yo.  RP:  Established patient presenting for annual gyn exam   HPI: Normal menstrual periods every month.  No breakthrough bleeding.  No pelvic pain.  No pain with intercourse.  Husband with vasectomy.  Breasts normal.  Will schedule first Mammo.  Body mass index 24.34.  Good fitness/Healthier diet with Veggies/fruits/fish. F/U here for Fasting Health labs.  Past medical history,surgical history, family history and social history were all reviewed and documented in the EPIC chart.  Gynecologic History Patient's last menstrual period was 03/28/2020.  Obstetric History OB History  Gravida Para Term Preterm AB Living  '1 1       1  ' SAB TAB Ectopic Multiple Live Births               # Outcome Date GA Lbr Len/2nd Weight Sex Delivery Anes PTL Lv  1 Para              ROS: A ROS was performed and pertinent positives and negatives are included in the history.  GENERAL: No fevers or chills. HEENT: No change in vision, no earache, sore throat or sinus congestion. NECK: No pain or stiffness. CARDIOVASCULAR: No chest pain or pressure. No palpitations. PULMONARY: No shortness of breath, cough or wheeze. GASTROINTESTINAL: No abdominal pain, nausea, vomiting or diarrhea, melena or bright red blood per rectum. GENITOURINARY: No urinary frequency, urgency, hesitancy or dysuria. MUSCULOSKELETAL: No joint or muscle pain, no back pain, no recent trauma. DERMATOLOGIC: No rash, no itching, no lesions. ENDOCRINE: No polyuria, polydipsia, no heat or cold intolerance. No recent change in weight. HEMATOLOGICAL: No anemia or easy bruising or bleeding. NEUROLOGIC: No headache, seizures, numbness, tingling or weakness. PSYCHIATRIC: No depression, no loss of interest in normal activity or change in sleep pattern.     Exam:   BP 120/78   Ht 5' 4.5" (1.638 m)   Wt 144 lb (65.3 kg)   LMP 03/28/2020 Comment: NO  BIRTH CONTROL   BMI 24.34 kg/m   Body mass index is 24.34 kg/m.  General appearance : Well developed well nourished female. No acute distress HEENT: Eyes: no retinal hemorrhage or exudates,  Neck supple, trachea midline, no carotid bruits, no thyroidmegaly Lungs: Clear to auscultation, no rhonchi or wheezes, or rib retractions  Heart: Regular rate and rhythm, no murmurs or gallops Breast:Examined in sitting and supine position were symmetrical in appearance, no palpable masses or tenderness,  no skin retraction, no nipple inversion, no nipple discharge, no skin discoloration, no axillary or supraclavicular lymphadenopathy Abdomen: no palpable masses or tenderness, no rebound or guarding Extremities: no edema or skin discoloration or tenderness  Pelvic: Vulva: Normal             Vagina: No gross lesions or discharge  Cervix: No gross lesions or discharge  Uterus  AV, normal size, shape and consistency, non-tender and mobile  Adnexa  Without masses or tenderness  Anus: Normal   Assessment/Plan:  44 y.o. female for annual exam   1. Well female exam with routine gynecological exam Normal gynecologic exam.  No indication to repeat a Pap test this year.  Breast exam normal.  Will schedule her first screening mammogram now.  Good body mass index at 24.34.  Continue with fitness and healthy nutrition.  Health labs with family physician. - CBC; Future - Comp Met (CMET); Future - Lipid panel; Future - TSH;  Future - VITAMIN D 25 Hydroxy (Vit-D Deficiency, Fractures); Future  2. Relies on partner vasectomy for contraception   Princess Bruins MD, 10:45 AM 04/16/2020

## 2020-04-19 ENCOUNTER — Encounter: Payer: Self-pay | Admitting: Obstetrics & Gynecology

## 2020-04-20 ENCOUNTER — Other Ambulatory Visit: Payer: 59

## 2020-04-20 ENCOUNTER — Other Ambulatory Visit: Payer: Self-pay

## 2020-04-20 DIAGNOSIS — Z01419 Encounter for gynecological examination (general) (routine) without abnormal findings: Secondary | ICD-10-CM

## 2020-04-21 LAB — COMPREHENSIVE METABOLIC PANEL
AG Ratio: 1.4 (calc) (ref 1.0–2.5)
ALT: 8 U/L (ref 6–29)
AST: 13 U/L (ref 10–30)
Albumin: 4.2 g/dL (ref 3.6–5.1)
Alkaline phosphatase (APISO): 80 U/L (ref 31–125)
BUN/Creatinine Ratio: 10 (calc) (ref 6–22)
BUN: 5 mg/dL — ABNORMAL LOW (ref 7–25)
CO2: 26 mmol/L (ref 20–32)
Calcium: 9.2 mg/dL (ref 8.6–10.2)
Chloride: 104 mmol/L (ref 98–110)
Creat: 0.51 mg/dL (ref 0.50–1.10)
Globulin: 2.9 g/dL (calc) (ref 1.9–3.7)
Glucose, Bld: 85 mg/dL (ref 65–99)
Potassium: 4 mmol/L (ref 3.5–5.3)
Sodium: 139 mmol/L (ref 135–146)
Total Bilirubin: 0.8 mg/dL (ref 0.2–1.2)
Total Protein: 7.1 g/dL (ref 6.1–8.1)

## 2020-04-21 LAB — CBC
HCT: 41.1 % (ref 35.0–45.0)
Hemoglobin: 13.3 g/dL (ref 11.7–15.5)
MCH: 29 pg (ref 27.0–33.0)
MCHC: 32.4 g/dL (ref 32.0–36.0)
MCV: 89.5 fL (ref 80.0–100.0)
MPV: 9.7 fL (ref 7.5–12.5)
Platelets: 292 10*3/uL (ref 140–400)
RBC: 4.59 10*6/uL (ref 3.80–5.10)
RDW: 12.3 % (ref 11.0–15.0)
WBC: 5.4 10*3/uL (ref 3.8–10.8)

## 2020-04-21 LAB — LIPID PANEL
Cholesterol: 153 mg/dL (ref <200)
HDL: 48 mg/dL — ABNORMAL LOW (ref >=50)
LDL Cholesterol (Calc): 88 mg/dL (calc)
Non-HDL Cholesterol (Calc): 105 mg/dL (calc) (ref <130)
Total CHOL/HDL Ratio: 3.2 (calc) (ref <5.0)
Triglycerides: 80 mg/dL (ref <150)

## 2020-04-21 LAB — TSH: TSH: 1.2 mIU/L

## 2020-04-21 LAB — VITAMIN D 25 HYDROXY (VIT D DEFICIENCY, FRACTURES): Vit D, 25-Hydroxy: 24 ng/mL — ABNORMAL LOW (ref 30–100)

## 2020-04-23 ENCOUNTER — Other Ambulatory Visit: Payer: Self-pay

## 2020-04-23 ENCOUNTER — Ambulatory Visit: Payer: Self-pay

## 2020-04-23 ENCOUNTER — Ambulatory Visit: Payer: 59 | Admitting: Rheumatology

## 2020-04-23 ENCOUNTER — Encounter: Payer: Self-pay | Admitting: Rheumatology

## 2020-04-23 VITALS — BP 105/71 | HR 69 | Resp 14 | Ht 65.75 in | Wt 138.0 lb

## 2020-04-23 DIAGNOSIS — Z79899 Other long term (current) drug therapy: Secondary | ICD-10-CM

## 2020-04-23 DIAGNOSIS — G8929 Other chronic pain: Secondary | ICD-10-CM

## 2020-04-23 DIAGNOSIS — M1990 Unspecified osteoarthritis, unspecified site: Secondary | ICD-10-CM | POA: Diagnosis not present

## 2020-04-23 DIAGNOSIS — M0609 Rheumatoid arthritis without rheumatoid factor, multiple sites: Secondary | ICD-10-CM

## 2020-04-23 DIAGNOSIS — E559 Vitamin D deficiency, unspecified: Secondary | ICD-10-CM

## 2020-04-23 DIAGNOSIS — M79642 Pain in left hand: Secondary | ICD-10-CM | POA: Diagnosis not present

## 2020-04-23 DIAGNOSIS — M79641 Pain in right hand: Secondary | ICD-10-CM | POA: Diagnosis not present

## 2020-04-23 DIAGNOSIS — M25511 Pain in right shoulder: Secondary | ICD-10-CM | POA: Diagnosis not present

## 2020-04-23 DIAGNOSIS — M1380 Other specified arthritis, unspecified site: Secondary | ICD-10-CM

## 2020-04-23 MED ORDER — PREDNISONE 5 MG PO TABS: ORAL_TABLET | ORAL | 0 refills | Status: DC

## 2020-04-23 NOTE — Patient Instructions (Signed)
Certolizumab pegol injection What is this medicine? CERTOLIZUMAB (SER toe LIZ oo mab) is used to treat rheumatoid arthritis, psoriatic arthritis, ankylosing spondylitis, non-radiographic axial spondyloarthritis, Crohn's disease, and plaque psoriasis. This medicine may be used for other purposes; ask your health care provider or pharmacist if you have questions. COMMON BRAND NAME(S): Cimzia What should I tell my health care provider before I take this medicine? They need to know if you have any of these conditions:  cancer  diabetes  Guillian-Barre syndrome  heart failure  hepatitis B or history of hepatitis B infection  immune system problems  infection or history of infections  low blood counts, like low white cell, platelet, or red cell counts  multiple sclerosis  recently received or scheduled to receive a vaccine  tuberculosis, a positive skin test for tuberculosis or have recently been in close contact with someone who has tuberculosis  an unusual or allergic reaction to certolizumab, other medicines, latex, rubber, foods, dyes, or preservatives  pregnant or trying to get pregnant  breast-feeding How should I use this medicine? This medicine is for injection under the skin. It is usually given by a health care professional in a hospital or clinic setting. If you get this medicine at home, you will be taught how to prepare and give this medicine. Use exactly as directed. Take your medicine at regular intervals. Do not take your medicine more often than directed. It is important that you put your used needles and syringes in a special sharps container. Do not put them in a trash can. If you do not have a sharps container, call your pharmacist or healthcare provider to get one. A special MedGuide will be given to you by the pharmacist with each prescription and refill. Be sure to read this information carefully each time. Talk to your pediatrician regarding the use of this  medicine in children. Special care may be needed. Overdosage: If you think you have taken too much of this medicine contact a poison control center or emergency room at once. NOTE: This medicine is only for you. Do not share this medicine with others. What if I miss a dose? It is important not to miss your dose. Call your doctor or health care professional if you are unable to keep an appointment. If you give your medicine by injection under the skin: If you miss a dose, take it as soon as you can. If it is almost time for your next dose, take only that dose. Do not take double or extra doses. Call your doctor or health care professional if you are not sure how to handle a missed dose. What may interact with this medicine? Do not take this medicine with any of the following medications:  biologic medicines such as abatacept, adalimumab, anakinra, etanercept, golimumab, infliximab, natalizumab, rituximab, secukinumab, tocilizumab, ustekinumab  live vaccines  tofacitinib This list may not describe all possible interactions. Give your health care provider a list of all the medicines, herbs, non-prescription drugs, or dietary supplements you use. Also tell them if you smoke, drink alcohol, or use illegal drugs. Some items may interact with your medicine. What should I watch for while using this medicine? Visit your doctor or health care professional for regular checks on your progress. Tell your doctor or healthcare professional if your symptoms do not start to get better or if they get worse. Your condition will be monitored carefully while you are receiving this medicine. You will be tested for tuberculosis (TB) before you start   this medicine. If your doctor prescribes any medicine for TB, you should start taking the TB medicine before starting this medicine. Make sure to finish the full course of TB medicine. Call your doctor or health care professional for advice if you get a fever, chills, sore  throat, or other symptoms of an infection. Do not treat yourself. This medicine may decrease your body's ability to fight infection. Try to avoid being around people who are sick. Talk to your doctor about your risk of cancer. You may be more at risk for certain types of cancers if you take this medicine. What side effects may I notice from receiving this medicine? Side effects that you should report to your doctor or health care professional as soon as possible:  allergic reactions like skin rash, itching or hives, swelling of the face, lips, or tongue  breathing problems  changes in vision  chest pain  joint or muscle pain  mouth sores  numbness or tingling in any part of your body  signs and symptoms of infection like fever or chills; cough; sore throat; pain or trouble passing urine  signs and symptoms of liver injury like dark yellow or brown urine; general ill feeling or flu-like symptoms; light-colored stools; loss of appetite; nausea; right upper belly pain; unusually weak or tired; yellowing of the eyes or skin  swelling of the legs or ankles  swollen lymph nodes in the neck, underarm, or groin areas  unexplained weight loss  unusual bleeding or bruising  unusually weak or tired Side effects that usually do not require medical attention (report to your doctor or health care professional if they continue or are bothersome):  irritation at site where injected This list may not describe all possible side effects. Call your doctor for medical advice about side effects. You may report side effects to FDA at 1-800-FDA-1088. Where should I keep my medicine? Keep out of the reach of children. If you are using this medicine at home, keep the syringes in the refrigerator between 2 to 8 degrees C (36 to 46 degrees F). Do not freeze. Protect from light. Keep this medicine in the original container. Throw away any unused medicine after the expiration date on the label. NOTE: This  sheet is a summary. It may not cover all possible information. If you have questions about this medicine, talk to your doctor, pharmacist, or health care provider.  2020 Elsevier/Gold Standard (2017-10-03 16:35:32)  

## 2020-04-25 NOTE — Progress Notes (Signed)
Please advise patient to get a chest x-ray.  Please notify her that TB Gold is positive.  Refer her to ID for evaluation and treatment of positive TB Gold.

## 2020-04-27 ENCOUNTER — Other Ambulatory Visit: Payer: Self-pay | Admitting: *Deleted

## 2020-04-27 DIAGNOSIS — R7612 Nonspecific reaction to cell mediated immunity measurement of gamma interferon antigen response without active tuberculosis: Secondary | ICD-10-CM

## 2020-05-01 NOTE — Progress Notes (Deleted)
Office Visit Note  Patient: Melissa Newman             Date of Birth: 1975-09-24           MRN: [id number]             PCP: Delorse Limber Referring: Delorse Limber Visit Date: 05/14/2020 Occupation: '@GUAROCC' @  Subjective:  No chief complaint on file.   History of Present Illness: Melissa Newman is a 44 y.o. female ***   Activities of Daily Living:  Patient reports morning stiffness for *** {minute/hour:19697}.   Patient {ACTIONS;DENIES/REPORTS:21021675::"Denies"} nocturnal pain.  Difficulty dressing/grooming: {ACTIONS;DENIES/REPORTS:21021675::"Denies"} Difficulty climbing stairs: {ACTIONS;DENIES/REPORTS:21021675::"Denies"} Difficulty getting out of chair: {ACTIONS;DENIES/REPORTS:21021675::"Denies"} Difficulty using hands for taps, buttons, cutlery, and/or writing: {ACTIONS;DENIES/REPORTS:21021675::"Denies"}  No Rheumatology ROS completed.   PMFS History:  There are no problems to display for this patient.   Past Medical History:  Diagnosis Date  . H. pylori infection 06/2018   s/p treatment -- no residual infection    Family History  Problem Relation Age of Onset  . Early death Mother   . COPD Mother   . COPD Father   . Hyperlipidemia Father   . Alcohol abuse Father   . Stroke Father   . Breast cancer Maternal Aunt 60  . Hyperlipidemia Sister   . Alcohol abuse Brother   . Healthy Son    Past Surgical History:  Procedure Laterality Date  . CESAREAN SECTION    . FINGER SURGERY  02/2020  . INNER EAR SURGERY    . tympanostomy Left    Social History   Social History Narrative  . Not on file   Immunization History  Administered Date(s) Administered  . Influenza,inj,Quad PF,6+ Mos 03/13/2019  . MMR 12/24/2018  . Varicella 12/24/2018     Objective: Vital Signs: There were no vitals taken for this visit.   Physical Exam   Musculoskeletal Exam: ***  CDAI Exam: CDAI Score: -- Patient Global: --; Provider Global: -- Swollen: --;  Tender: -- Joint Exam 05/14/2020   No joint exam has been documented for this visit   There is currently no information documented on the homunculus. Go to the Rheumatology activity and complete the homunculus joint exam.  Investigation: No additional findings.  Imaging: XR Hand 2 View Left  Result Date: 04/23/2020 Juxta-articular osteopenia was noted.  No MCP, intercarpal or radiocarpal joint space narrowing was noted.  Severe narrowing of fourth PIP joint was noted. Impression: These findings are consistent with severe inflammatory arthritis.  XR Hand 2 View Right  Result Date: 04/23/2020 Juxta-articular osteopenia was noted.  Severe metacarpocarpal intercarpal and radiocarpal joint space narrowing was noted.  Erosive changes were noted in the carpal bones and also base of metacarpals.  Narrowing of first second and third MCP joints was noted.  PIP narrowing was noted. Impression: These findings are consistent with severe erosive inflammatory arthritis most likely rheumatoid arthritis.  XR Shoulder Right  Result Date: 04/23/2020 No glenohumeral or acromioclavicular joint space narrowing was noted.  No erosive changes were noted. Impression: Unremarkable x-ray of the shoulder joint.   Recent Labs: Lab Results  Component Value Date   WBC 5.4 04/20/2020   HGB 13.3 04/20/2020   PLT 292 04/20/2020   NA 139 04/20/2020   K 4.0 04/20/2020   CL 104 04/20/2020   CO2 26 04/20/2020   GLUCOSE 85 04/20/2020   BUN 5 (L) 04/20/2020   CREATININE 0.51 04/20/2020   BILITOT 0.8 04/20/2020   AST 13  04/20/2020   ALT 8 04/20/2020   PROT 7.8 04/23/2020   CALCIUM 9.2 04/20/2020   QFTBGOLDPLUS POSITIVE (A) 04/23/2020  April 23, 2020 UA negative, SPEP normal, TB Gold positive, G6PD normal, immunoglobulins IgM mildly elevated.  ANA 1: 80 cytoplasmic, RF negative, anti-CCP negative, uric acid 5.1, ESR 17  Speciality Comments: No specialty comments available.  Procedures:  No procedures  performed Allergies: Contrast media [iodinated diagnostic agents]   Assessment / Plan:     Visit Diagnoses: No diagnosis found.  Orders: No orders of the defined types were placed in this encounter.  No orders of the defined types were placed in this encounter.   Face-to-face time spent with patient was *** minutes. Greater than 50% of time was spent in counseling and coordination of care.  Follow-Up Instructions: No follow-ups on file.   Bo Merino, MD  Note - This record has been created using Editor, commissioning.  Chart creation errors have been sought, but may not always  have been located. Such creation errors do not reflect on  the standard of medical care.

## 2020-05-05 LAB — URINALYSIS, ROUTINE W REFLEX MICROSCOPIC
Bilirubin Urine: NEGATIVE
Glucose, UA: NEGATIVE
Hgb urine dipstick: NEGATIVE
Ketones, ur: NEGATIVE
Leukocytes,Ua: NEGATIVE
Nitrite: NEGATIVE
Protein, ur: NEGATIVE
Specific Gravity, Urine: 1.007 (ref 1.001–1.03)
pH: 7 (ref 5.0–8.0)

## 2020-05-05 LAB — QUANTIFERON-TB GOLD PLUS
Mitogen-NIL: >10 IU/mL
NIL: 0.06 IU/mL
QuantiFERON-TB Gold Plus: POSITIVE — AB
TB1-NIL: 1.53 IU/mL
TB2-NIL: 1.64 IU/mL

## 2020-05-05 LAB — IGG, IGA, IGM
IgG (Immunoglobin G), Serum: 1346 mg/dL (ref 600–1640)
IgM, Serum: 481 mg/dL — ABNORMAL HIGH (ref 50–300)
Immunoglobulin A: 277 mg/dL (ref 47–310)

## 2020-05-05 LAB — PROTEIN ELECTROPHORESIS, SERUM, WITH REFLEX
Albumin ELP: 4.3 g/dL (ref 3.8–4.8)
Alpha 1: 0.3 g/dL (ref 0.2–0.3)
Alpha 2: 0.8 g/dL (ref 0.5–0.9)
Beta 2: 0.4 g/dL (ref 0.2–0.5)
Beta Globulin: 0.5 g/dL (ref 0.4–0.6)
Gamma Globulin: 1.5 g/dL (ref 0.8–1.7)
Total Protein: 7.8 g/dL (ref 6.1–8.1)

## 2020-05-05 LAB — ANTI-NUCLEAR AB-TITER (ANA TITER): ANA Titer 1: 1:80 {titer} — ABNORMAL HIGH

## 2020-05-05 LAB — RHEUMATOID FACTOR: Rheumatoid fact SerPl-aCnc: <14 IU/mL (ref <14)

## 2020-05-05 LAB — 14-3-3 ETA PROTEIN: 14-3-3 eta Protein: <0.2 ng/mL (ref <0.2)

## 2020-05-05 LAB — URIC ACID: Uric Acid, Serum: 5.1 mg/dL (ref 2.5–7.0)

## 2020-05-05 LAB — ANA: Anti Nuclear Antibody (ANA): POSITIVE — AB

## 2020-05-05 LAB — GLUCOSE 6 PHOSPHATE DEHYDROGENASE: G-6PDH: 11.9 U/g Hgb (ref 7.0–20.5)

## 2020-05-05 LAB — SEDIMENTATION RATE: Sed Rate: 17 mm/h (ref 0–20)

## 2020-05-05 LAB — CYCLIC CITRUL PEPTIDE ANTIBODY, IGG: Cyclic Citrullin Peptide Ab: <16 UNITS

## 2020-05-12 ENCOUNTER — Ambulatory Visit
Admission: RE | Admit: 2020-05-12 | Discharge: 2020-05-12 | Disposition: A | Discharge status: Home / Self Care | Payer: 59 | Source: Ambulatory Visit | Attending: Obstetrics & Gynecology | Admitting: Obstetrics & Gynecology

## 2020-05-12 ENCOUNTER — Other Ambulatory Visit: Payer: Self-pay

## 2020-05-12 DIAGNOSIS — Z1231 Encounter for screening mammogram for malignant neoplasm of breast: Secondary | ICD-10-CM

## 2020-05-14 ENCOUNTER — Ambulatory Visit: Payer: 59 | Admitting: Rheumatology

## 2020-05-14 DIAGNOSIS — Z79899 Other long term (current) drug therapy: Secondary | ICD-10-CM

## 2020-05-14 DIAGNOSIS — G8929 Other chronic pain: Secondary | ICD-10-CM

## 2020-05-14 DIAGNOSIS — M1990 Unspecified osteoarthritis, unspecified site: Secondary | ICD-10-CM

## 2020-05-14 DIAGNOSIS — R7612 Nonspecific reaction to cell mediated immunity measurement of gamma interferon antigen response without active tuberculosis: Secondary | ICD-10-CM

## 2020-05-14 DIAGNOSIS — E559 Vitamin D deficiency, unspecified: Secondary | ICD-10-CM

## 2020-05-20 ENCOUNTER — Ambulatory Visit: Payer: 59

## 2020-06-01 NOTE — Progress Notes (Signed)
Please contact patient to check when is her appointment with infectious disease.  It seems that they are having hard time reaching her.  We can also send her a letter about reaching out to ID to schedule an appointment.

## 2020-06-08 ENCOUNTER — Encounter: Payer: Self-pay | Admitting: *Deleted

## 2020-06-09 ENCOUNTER — Telehealth: Payer: Self-pay

## 2020-06-09 NOTE — Telephone Encounter (Signed)
[name] from [facility] called stating they have tried to contact patient 4 times to schedule an appointment.  Patient has voicemail that is not set up.

## 2020-06-10 NOTE — Telephone Encounter (Signed)
Spoke to patient's husband, patient unable to come to phone, left message for patient to return call

## 2020-06-12 ENCOUNTER — Encounter: Payer: Self-pay | Admitting: *Deleted

## 2020-07-28 ENCOUNTER — Encounter: Payer: Self-pay | Admitting: *Deleted

## 2020-07-28 NOTE — Progress Notes (Signed)
Patient has not responded to the phone calls or to the letter we mailed .  She also did not respond to the ID clinic.  She is not a schedule a follow-up visit.  Please send a discharge letter.

## 2020-08-20 ENCOUNTER — Ambulatory Visit (INDEPENDENT_AMBULATORY_CARE_PROVIDER_SITE_OTHER): Payer: 59 | Admitting: Nurse Practitioner

## 2020-08-20 ENCOUNTER — Ambulatory Visit (HOSPITAL_COMMUNITY)
Admission: EM | Admit: 2020-08-20 | Discharge: 2020-08-20 | Disposition: A | Discharge status: Home / Self Care | Payer: 59 | Attending: Internal Medicine | Admitting: Internal Medicine

## 2020-08-20 ENCOUNTER — Encounter: Payer: Self-pay | Admitting: Nurse Practitioner

## 2020-08-20 ENCOUNTER — Encounter (HOSPITAL_COMMUNITY): Payer: Self-pay

## 2020-08-20 ENCOUNTER — Other Ambulatory Visit: Payer: Self-pay

## 2020-08-20 VITALS — BP 114/70 | HR 68 | Temp 100.0°F | Resp 16 | Wt 146.0 lb

## 2020-08-20 DIAGNOSIS — Z1152 Encounter for screening for COVID-19: Secondary | ICD-10-CM

## 2020-08-20 DIAGNOSIS — Z112 Encounter for screening for other bacterial diseases: Secondary | ICD-10-CM | POA: Insufficient documentation

## 2020-08-20 DIAGNOSIS — U071 COVID-19: Secondary | ICD-10-CM | POA: Insufficient documentation

## 2020-08-20 DIAGNOSIS — J029 Acute pharyngitis, unspecified: Secondary | ICD-10-CM | POA: Insufficient documentation

## 2020-08-20 DIAGNOSIS — R8290 Unspecified abnormal findings in urine: Secondary | ICD-10-CM | POA: Diagnosis not present

## 2020-08-20 LAB — SARS CORONAVIRUS 2 (TAT 6-24 HRS): SARS Coronavirus 2: POSITIVE — AB

## 2020-08-20 LAB — POCT RAPID STREP A, ED / UC: Streptococcus, Group A Screen (Direct): NEGATIVE

## 2020-08-20 NOTE — Discharge Instructions (Signed)
Continue tylenol for fevers/chills

## 2020-08-20 NOTE — ED Triage Notes (Signed)
Pt states she was seen by OB this morning and they suggested she be seen here to rule out step throat, pain began this morning has had chills with no fever past few days

## 2020-08-20 NOTE — Patient Instructions (Signed)
Please go to Urgent care for strep culture and covid test.

## 2020-08-20 NOTE — ED Provider Notes (Signed)
[facility]    CSN: [id number] Arrival date & time: 08/20/20  1310      History   Chief Complaint Chief Complaint  Patient presents with  . Sore Throat    HPI Melissa Newman is a 45 y.o. female presenting for sore throat. Pt states she was seen by gyn this morning and they suggested she be seen here to rule out step throat, pain began this morning has had chills with no fever past few days. States sore throat is worse with coughing. Feeling well otherwise. Denies  n/v/d, shortness of breath, chest pain, , congestion, facial pain, teeth pain, headaches,  loss of taste/smell, swollen lymph nodes, ear pain.     HPI  Past Medical History:  Diagnosis Date  . H. pylori infection 06/2018   s/p treatment -- no residual infection    There are no problems to display for this patient.   Past Surgical History:  Procedure Laterality Date  . CESAREAN SECTION    . FINGER SURGERY  02/2020  . INNER EAR SURGERY    . tympanostomy Left     OB History    Gravida  1   Para  1   Term      Preterm      AB      Living  1     SAB      IAB      Ectopic      Multiple      Live Births               Home Medications    Prior to Admission medications   Medication Sig Start Date End Date Taking? Authorizing Provider  ascorbic acid (VITAMIN C) 100 MG tablet Take by mouth.    [provider]  cholecalciferol (VITAMIN D3) 25 MCG (1000 UNIT) tablet Take 1,000 Units by mouth daily.    [provider]  cyanocobalamin 100 MCG tablet Take by mouth.    [provider]    Family History Family History  Problem Relation Age of Onset  . Early death Mother   . COPD Mother   . COPD Father   . Hyperlipidemia Father   . Alcohol abuse Father   . Stroke Father   . Breast cancer Maternal Aunt 60  . Hyperlipidemia Sister   . Alcohol abuse Brother   . Healthy Son     Social History Social History   Tobacco Use  . Smoking status: Never  Smoker  . Smokeless tobacco: Never Used  Vaping Use  . Vaping Use: Never used  Substance Use Topics  . Alcohol use: Never  . Drug use: Never     Allergies   Contrast media [iodinated diagnostic agents]   Review of Systems Review of Systems  Constitutional: Negative for appetite change, chills and fever.  HENT: Positive for sore throat. Negative for congestion, ear pain, rhinorrhea, sinus pressure and sinus pain.   Eyes: Negative for redness and visual disturbance.  Respiratory: Negative for cough, chest tightness, shortness of breath and wheezing.   Cardiovascular: Negative for chest pain and palpitations.  Gastrointestinal: Negative for abdominal pain, constipation, diarrhea, nausea and vomiting.  Genitourinary: Negative for dysuria, frequency and urgency.  Musculoskeletal: Negative for myalgias.  Neurological: Negative for dizziness, weakness and headaches.  Psychiatric/Behavioral: Negative for confusion.  All other systems reviewed and are negative.    Physical Exam Triage Vital Signs ED Triage Vitals  Enc Vitals Group     BP  08/20/20 1403 116/75     Pulse Rate 08/20/20 1403 (!) 105     Resp 08/20/20 1403 18     Temp 08/20/20 1403 (!) 100.5 F (38.1 C)     Temp src --      SpO2 08/20/20 1403 98 %     Weight --      Height --      Head Circumference --      Peak Flow --      Pain Score 08/20/20 1359 7     Pain Loc --      Pain Edu? --      Excl. in GC? --    No data found.  Updated Vital Signs BP 116/75   Pulse (!) 105   Temp (!) 100.5 F (38.1 C)   Resp 18   LMP 08/01/2020 (Exact Date)   SpO2 98%   Visual Acuity Right Eye Distance:   Left Eye Distance:   Bilateral Distance:    Right Eye Near:   Left Eye Near:    Bilateral Near:     Physical Exam Vitals reviewed.  Constitutional:      General: She is not in acute distress.    Appearance: Normal appearance. She is not ill-appearing.  HENT:     Head: Normocephalic and atraumatic.     Right  Ear: Hearing, tympanic membrane, ear canal and external ear normal. No swelling or tenderness. There is no impacted cerumen. No mastoid tenderness. Tympanic membrane is not perforated, erythematous, retracted or bulging.     Left Ear: Hearing, tympanic membrane, ear canal and external ear normal. No swelling or tenderness. There is no impacted cerumen. No mastoid tenderness. Tympanic membrane is not perforated, erythematous, retracted or bulging.     Nose:     Right Sinus: No maxillary sinus tenderness or frontal sinus tenderness.     Left Sinus: No maxillary sinus tenderness or frontal sinus tenderness.     Mouth/Throat:     Mouth: Mucous membranes are moist.     Pharynx: Uvula midline. No oropharyngeal exudate or posterior oropharyngeal erythema.     Tonsils: No tonsillar exudate. 0 on the right. 0 on the left.     Comments: Smooth erythema posterior pharynx.  Cardiovascular:     Rate and Rhythm: Normal rate and regular rhythm.     Heart sounds: Normal heart sounds.  Pulmonary:     Breath sounds: Normal breath sounds and air entry. No wheezing, rhonchi or rales.  Chest:     Chest wall: No tenderness.  Abdominal:     General: Abdomen is flat. Bowel sounds are normal.     Tenderness: There is no abdominal tenderness. There is no guarding or rebound.  Lymphadenopathy:     Cervical: No cervical adenopathy.  Neurological:     General: No focal deficit present.     Mental Status: She is alert and oriented to person, place, and time.  Psychiatric:        Attention and Perception: Attention and perception normal.        Mood and Affect: Mood and affect normal.        Behavior: Behavior normal. Behavior is cooperative.        Thought Content: Thought content normal.        Judgment: Judgment normal.      UC Treatments / Results  Labs (all labs ordered are listed, but only abnormal results are displayed) Labs Reviewed  CULTURE, GROUP A STREP [facility])  SARS CORONAVIRUS 2 (TAT 6-24 HRS)   POCT RAPID STREP A, ED / UC    EKG   Radiology No results found.  Procedures Procedures (including critical care time)  Medications Ordered in UC Medications - No data to display  Initial Impression / Assessment and Plan / UC Course  I have reviewed the triage vital signs and the nursing notes.  Pertinent labs & imaging results that were available during my care of the patient were reviewed by me and considered in my medical decision making (see chart for details).      45 year old female presenting with sore throat.   Rapid strep negative, culture sent.  covid sent.   Tylenol/ibuprofen for fevers/chills.   Spent over 30 minutes obtaining H&P, performing physical, discussing results, treatment plan and plan for follow-up with patient. Patient agrees with plan.   This chart was dictated using voice recognition software, Dragon. Despite the best efforts of this provider to proofread and correct errors, errors may still occur which can change documentation meaning.  Final Clinical Impressions(s) / UC Diagnoses   Final diagnoses:  Viral pharyngitis  Encounter for screening for COVID-19  Screening for streptococcal infection     Discharge Instructions     Continue tylenol for fevers/chills    ED Prescriptions    None     PDMP not reviewed this encounter.   Rhys Martini, PA-C 08/20/20 [phone]

## 2020-08-20 NOTE — Progress Notes (Signed)
GYNECOLOGY  VISIT  CC:   Last Tuesday started with body pain and chills, then noticed pink in urine. Noticed an odor in urine a couple days ago. Today urine seems better. Drinking a lot of fluids. Still has chills. Feels like she has an infection, but doesn't know what  HPI: 45 y.o. G1P1 Married [place] female here for chills, feels like she has infection.     Denies vaginal itching, burning, irritation, no abnormal vaginal discharge. No problems with period.  Denies any pain any where specific, denies dysuria, frequency or urgency Denies back pain. Denies N/V  Started coughing and sore throat this morning.  Has not had covid vaccine, does not believe in it. Eating fruits and vegetables to prevent it. Has not had a known exposure.  GYNECOLOGIC HISTORY: Patient's last menstrual period was 08/01/2020 (exact date). Contraception: none Menopausal hormone therapy: none  There are no problems to display for this patient.   Past Medical History:  Diagnosis Date  . H. pylori infection 06/2018   s/p treatment -- no residual infection    Past Surgical History:  Procedure Laterality Date  . CESAREAN SECTION    . FINGER SURGERY  02/2020  . INNER EAR SURGERY    . tympanostomy Left     MEDS:   Current Outpatient Medications on File Prior to Visit  Medication Sig Dispense Refill  . ascorbic acid (VITAMIN C) 100 MG tablet Take by mouth.    . cholecalciferol (VITAMIN D3) 25 MCG (1000 UNIT) tablet Take 1,000 Units by mouth daily.    . cyanocobalamin 100 MCG tablet Take by mouth.     No current facility-administered medications on file prior to visit.    ALLERGIES: Contrast media [iodinated diagnostic agents]  Family History  Problem Relation Age of Onset  . Early death Mother   . COPD Mother   . COPD Father   . Hyperlipidemia Father   . Alcohol abuse Father   . Stroke Father   . Breast cancer Maternal Aunt 60  . Hyperlipidemia Sister   . Alcohol abuse Brother   . Healthy Son       Review of Systems  PHYSICAL EXAMINATION:    BP 114/70   Pulse 68   Resp 16   Wt 146 lb (66.2 kg)   LMP 08/01/2020 (Exact Date)   BMI 23.74 kg/m     General appearance: alert, cooperative, no acute distress  CV:  without murmur or extra heart sounds or rate=100 while auscultating Lungs:  clear to auscultation, no wheezes, rales or rhonchi, pt coughing, no visible respiratory distresss   Assessment: Suspect viral illness hematuria  Plan: Will send urine for culture Advised pt to go to Urgent care for Strep and Covid test

## 2020-08-21 ENCOUNTER — Telehealth: Payer: Self-pay

## 2020-08-21 NOTE — Telephone Encounter (Signed)
Called to discuss with patient about COVID-19 symptoms and the use of one of the available treatments for those with mild to moderate Covid symptoms and at a high risk of hospitalization.  Pt appears to qualify for outpatient treatment due to co-morbid conditions and/or a member of an at-risk group in accordance with the FDA Emergency Use Authorization.    Symptom onset: 08/20/20 Vaccinated: No Booster? No Immunocompromised? No Qualifiers: None  Declines any further treatment.  Esther Hardy

## 2020-08-22 LAB — URINALYSIS, COMPLETE W/RFL CULTURE
Bilirubin Urine: NEGATIVE
Glucose, UA: NEGATIVE
Hyaline Cast: NONE SEEN /LPF
Ketones, ur: NEGATIVE
Leukocyte Esterase: NEGATIVE
Nitrites, Initial: NEGATIVE
Protein, ur: NEGATIVE
Specific Gravity, Urine: 1.02 (ref 1.001–1.03)
pH: 5 (ref 5.0–8.0)

## 2020-08-22 LAB — URINE CULTURE
MICRO NUMBER:: 11547127
SPECIMEN QUALITY:: ADEQUATE

## 2020-08-22 LAB — CULTURE INDICATED

## 2020-08-23 LAB — CULTURE, GROUP A STREP (THRC)

## 2020-08-25 ENCOUNTER — Ambulatory Visit (HOSPITAL_COMMUNITY)
Admission: EM | Admit: 2020-08-25 | Discharge: 2020-08-25 | Disposition: A | Discharge status: Home / Self Care | Payer: 59 | Attending: Emergency Medicine | Admitting: Emergency Medicine

## 2020-08-25 ENCOUNTER — Ambulatory Visit (INDEPENDENT_AMBULATORY_CARE_PROVIDER_SITE_OTHER): Payer: 59

## 2020-08-25 ENCOUNTER — Encounter (HOSPITAL_COMMUNITY): Payer: Self-pay

## 2020-08-25 ENCOUNTER — Other Ambulatory Visit: Payer: Self-pay

## 2020-08-25 DIAGNOSIS — R042 Hemoptysis: Secondary | ICD-10-CM | POA: Diagnosis not present

## 2020-08-25 DIAGNOSIS — U071 COVID-19: Secondary | ICD-10-CM

## 2020-08-25 DIAGNOSIS — J1282 Pneumonia due to coronavirus disease 2019: Secondary | ICD-10-CM

## 2020-08-25 DIAGNOSIS — J189 Pneumonia, unspecified organism: Secondary | ICD-10-CM

## 2020-08-25 MED ORDER — AZITHROMYCIN 250 MG PO TABS: 250.0000 mg | ORAL_TABLET | Freq: Every day | ORAL | 0 refills | Status: DC

## 2020-08-25 NOTE — ED Provider Notes (Signed)
[facility]    CSN: [id number] Arrival date & time: 08/25/20  0845      History   Chief Complaint Chief Complaint  Patient presents with  . Cough    HPI Melissa Newman is a 45 y.o. female.   Patient presents with cough x3 days.  She reports blood in her sputum yesterday.  She tested positive for COVID on 08/20/2020.  Patient also reports she had a positive TB blood test last year but states her chest x-ray was normal.  She denies fever, chills, shortness of breath, or other symptoms.  No treatments attempted at home.  No history of smoking.  The history is provided by the patient and medical records.    Past Medical History:  Diagnosis Date  . H. pylori infection 06/2018   s/p treatment -- no residual infection    There are no problems to display for this patient.   Past Surgical History:  Procedure Laterality Date  . CESAREAN SECTION    . FINGER SURGERY  02/2020  . INNER EAR SURGERY    . tympanostomy Left     OB History    Gravida  1   Para  1   Term      Preterm      AB      Living  1     SAB      IAB      Ectopic      Multiple      Live Births               Home Medications    Prior to Admission medications   Medication Sig Start Date End Date Taking? Authorizing Provider  azithromycin (ZITHROMAX) 250 MG tablet Take 1 tablet (250 mg total) by mouth daily. Take first 2 tablets together, then 1 every day until finished. 08/25/20  Yes Mickie Bail, NP  ascorbic acid (VITAMIN C) 100 MG tablet Take by mouth.    [provider]  cholecalciferol (VITAMIN D3) 25 MCG (1000 UNIT) tablet Take 1,000 Units by mouth daily.    [provider]  cyanocobalamin 100 MCG tablet Take by mouth.    [provider]    Family History Family History  Problem Relation Age of Onset  . Early death Mother   . COPD Mother   . COPD Father   . Hyperlipidemia Father   . Alcohol abuse Father   . Stroke Father   . Breast  cancer Maternal Aunt 60  . Hyperlipidemia Sister   . Alcohol abuse Brother   . Healthy Son     Social History Social History   Tobacco Use  . Smoking status: Never Smoker  . Smokeless tobacco: Never Used  Vaping Use  . Vaping Use: Never used  Substance Use Topics  . Alcohol use: Never  . Drug use: Never     Allergies   Contrast media [iodinated diagnostic agents]   Review of Systems Review of Systems  Constitutional: Negative for chills and fever.  HENT: Negative for ear pain and sore throat.   Eyes: Negative for pain and visual disturbance.  Respiratory: Positive for cough. Negative for shortness of breath.   Cardiovascular: Negative for chest pain and palpitations.  Gastrointestinal: Negative for abdominal pain, diarrhea and vomiting.  Genitourinary: Negative for dysuria and hematuria.  Musculoskeletal: Negative for arthralgias and back pain.  Skin: Negative for color change and rash.  Neurological: Negative for seizures and syncope.  All other systems  reviewed and are negative.    Physical Exam Triage Vital Signs ED Triage Vitals  Enc Vitals Group     BP 08/25/20 0933 (!) 90/45     Pulse Rate 08/25/20 0933 65     Resp 08/25/20 0933 18     Temp 08/25/20 0933 99.1 F (37.3 C)     Temp Source 08/25/20 0933 Oral     SpO2 08/25/20 0933 100 %     Weight --      Height --      Head Circumference --      Peak Flow --      Pain Score 08/25/20 0932 0     Pain Loc --      Pain Edu? --      Excl. in GC? --    No data found.  Updated Vital Signs BP 113/77 (BP Location: Left Arm)   Pulse 72   Temp 99.1 F (37.3 C) (Oral)   Resp 16   LMP 08/01/2020 (Exact Date)   SpO2 98%   Visual Acuity Right Eye Distance:   Left Eye Distance:   Bilateral Distance:    Right Eye Near:   Left Eye Near:    Bilateral Near:     Physical Exam Vitals and nursing note reviewed.  Constitutional:      General: She is not in acute distress.    Appearance: She is  well-developed and well-nourished.  HENT:     Head: Normocephalic and atraumatic.     Right Ear: Tympanic membrane normal.     Left Ear: Tympanic membrane normal.     Nose: Nose normal.     Mouth/Throat:     Mouth: Mucous membranes are moist.     Pharynx: Oropharynx is clear.  Eyes:     Conjunctiva/sclera: Conjunctivae normal.  Cardiovascular:     Rate and Rhythm: Normal rate and regular rhythm.     Heart sounds: Normal heart sounds.  Pulmonary:     Effort: Pulmonary effort is normal. No respiratory distress.     Breath sounds: Normal breath sounds.  Abdominal:     Palpations: Abdomen is soft.     Tenderness: There is no abdominal tenderness.  Musculoskeletal:        General: No edema.     Cervical back: Neck supple.  Skin:    General: Skin is warm and dry.  Neurological:     General: No focal deficit present.     Mental Status: She is alert and oriented to person, place, and time.     Gait: Gait normal.  Psychiatric:        Mood and Affect: Mood and affect and mood normal.        Behavior: Behavior normal.      UC Treatments / Results  Labs (all labs ordered are listed, but only abnormal results are displayed) Labs Reviewed - No data to display  EKG   Radiology DG Chest 2 View  Result Date: 08/25/2020 CLINICAL DATA:  Hemoptysis.  COVID positive. EXAM: CHEST - 2 VIEW COMPARISON:  No prior. FINDINGS: Heart size normal. Mild left perihilar nodular interstitial infiltrate. Findings most consistent with pneumonitis. Minimal infiltrate right mid lung may also be present. Follow-up chest x-rays to demonstrate clearing suggested. No pleural effusion or pneumothorax. IMPRESSION: Mild left perihilar nodular interstitial infiltrate consistent with pneumonitis. Minimal infiltrate in the right mid lung may also be present. Electronically Signed   By: Maisie Fus  Register   On: 08/25/2020 10:21  Procedures Procedures (including critical care time)  Medications Ordered in  UC Medications - No data to display  Initial Impression / Assessment and Plan / UC Course  I have reviewed the triage vital signs and the nursing notes.  Pertinent labs & imaging results that were available during my care of the patient were reviewed by me and considered in my medical decision making (see chart for details).   Pneumonia due to COVID-19 virus.  Chest x-ray shows infiltrates bilaterally.  Treating with Zithromax.  Instructed patient to establish a PCP for follow-up; assistance from [facility] requested.  Discussed with patient that she needs to have a follow-up chest x-ray in 1 to 2 weeks per the radiologist.  Patient agrees to plan of care.   Final Clinical Impressions(s) / UC Diagnoses   Final diagnoses:  Pneumonia due to COVID-19 virus     Discharge Instructions     Take the antibiotic as directed.    Follow up with your primary care provider in 1 week; sooner if your symptoms are not improving.         ED Prescriptions    Medication Sig Dispense Auth. Provider   azithromycin (ZITHROMAX) 250 MG tablet Take 1 tablet (250 mg total) by mouth daily. Take first 2 tablets together, then 1 every day until finished. 6 tablet Mickie Bail, NP     PDMP not reviewed this encounter.   Mickie Bail, NP 08/25/20 1034

## 2020-08-25 NOTE — Discharge Instructions (Signed)
Take the antibiotic as directed.    Follow up with your primary care provider in 1 week; sooner if your symptoms are not improving.

## 2020-08-25 NOTE — ED Triage Notes (Signed)
Pt presents with cough X 3 days. Pt states she noticed blood in her saliva. Pt states she tested positive for COVID on 02/17.

## 2020-09-04 ENCOUNTER — Encounter (HOSPITAL_COMMUNITY): Payer: Self-pay

## 2020-09-04 ENCOUNTER — Other Ambulatory Visit: Payer: Self-pay

## 2020-09-04 ENCOUNTER — Ambulatory Visit (HOSPITAL_COMMUNITY)
Admission: EM | Admit: 2020-09-04 | Discharge: 2020-09-04 | Disposition: A | Discharge status: Home / Self Care | Payer: 59 | Attending: Medical Oncology | Admitting: Medical Oncology

## 2020-09-04 DIAGNOSIS — J1282 Pneumonia due to coronavirus disease 2019: Secondary | ICD-10-CM

## 2020-09-04 DIAGNOSIS — R059 Cough, unspecified: Secondary | ICD-10-CM

## 2020-09-04 DIAGNOSIS — U071 COVID-19: Secondary | ICD-10-CM | POA: Diagnosis not present

## 2020-09-04 MED ORDER — BENZONATATE 100 MG PO CAPS: 100.0000 mg | ORAL_CAPSULE | Freq: Three times a day (TID) | ORAL | 0 refills | Status: DC

## 2020-09-04 NOTE — ED Triage Notes (Signed)
Pt states she is here for a follow up from the previous visit. She states her sxs have improved. Pt states she is still having a dry cough.

## 2020-09-04 NOTE — ED Provider Notes (Signed)
[facility]    CSN: [id number] Arrival date & time: 09/04/20  9024      History   Chief Complaint Chief Complaint  Patient presents with  . Cough    HPI Melissa Newman is a 45 y.o. female.   HPI   Chest congestion: Pt was diagnosed with COVID-19 on 08/20/2020. She was then diagnosed with pneumonia on 08/25/2020. She was treated with azithromycin. Today she presents for follow up.  Today she states that she is feeling a lot better.  She now has a dry cough but is not having a productive cough.  She is not having any fatigue, weakness, shortness of breath or chest pain and she has not had any fevers.  In general she has tolerated her azithromycin well and has finished her treatment course.  She states that she has a follow-up with a new primary care provider on the 15th of this month.  Past Medical History:  Diagnosis Date  . H. pylori infection 06/2018   s/p treatment -- no residual infection    There are no problems to display for this patient.   Past Surgical History:  Procedure Laterality Date  . CESAREAN SECTION    . FINGER SURGERY  02/2020  . INNER EAR SURGERY    . tympanostomy Left     OB History    Gravida  1   Para  1   Term      Preterm      AB      Living  1     SAB      IAB      Ectopic      Multiple      Live Births               Home Medications    Prior to Admission medications   Medication Sig Start Date End Date Taking? Authorizing Provider  benzonatate (TESSALON) 100 MG capsule Take 1 capsule (100 mg total) by mouth every 8 (eight) hours. 09/04/20  Yes Ranald Alessio M, PA-C  ascorbic acid (VITAMIN C) 100 MG tablet Take by mouth.    [provider]  azithromycin (ZITHROMAX) 250 MG tablet Take 1 tablet (250 mg total) by mouth daily. Take first 2 tablets together, then 1 every day until finished. 08/25/20   Mickie Bail, NP  cholecalciferol (VITAMIN D3) 25 MCG (1000 UNIT) tablet Take 1,000 Units by mouth  daily.    [provider]  cyanocobalamin 100 MCG tablet Take by mouth.    [provider]    Family History Family History  Problem Relation Age of Onset  . Early death Mother   . COPD Mother   . COPD Father   . Hyperlipidemia Father   . Alcohol abuse Father   . Stroke Father   . Breast cancer Maternal Aunt 60  . Hyperlipidemia Sister   . Alcohol abuse Brother   . Healthy Son     Social History Social History   Tobacco Use  . Smoking status: Never Smoker  . Smokeless tobacco: Never Used  Vaping Use  . Vaping Use: Never used  Substance Use Topics  . Alcohol use: Never  . Drug use: Never     Allergies   Contrast media [iodinated diagnostic agents]   Review of Systems Review of Systems  As stated above in HPI Physical Exam Triage Vital Signs  No data found.  Updated Vital Signs BP (!) 102/53 (BP Location: Right Arm)  Pulse 69   Temp 98.4 F (36.9 C) (Oral)   Resp 17   LMP 08/17/2020 (Approximate)   SpO2 99%   Physical Exam Vitals and nursing note reviewed.  Constitutional:      General: She is not in acute distress.    Appearance: Normal appearance. She is not ill-appearing, toxic-appearing or diaphoretic.  HENT:     Head: Normocephalic.     Nose: Nose normal. No congestion or rhinorrhea.     Mouth/Throat:     Mouth: Mucous membranes are moist.     Pharynx: Oropharynx is clear. No posterior oropharyngeal erythema.  Eyes:     Extraocular Movements: Extraocular movements intact.     Pupils: Pupils are equal, round, and reactive to light.  Cardiovascular:     Rate and Rhythm: Normal rate and regular rhythm.     Heart sounds: Normal heart sounds.  Pulmonary:     Effort: Pulmonary effort is normal. No respiratory distress.     Breath sounds: Normal breath sounds. No stridor. No wheezing, rhonchi or rales.  Chest:     Chest wall: No tenderness.  Musculoskeletal:     Cervical back: Normal range of motion and neck supple.   Lymphadenopathy:     Cervical: No cervical adenopathy.  Neurological:     Mental Status: She is alert.      UC Treatments / Results  Labs (all labs ordered are listed, but only abnormal results are displayed) Labs Reviewed - No data to display  EKG   Radiology No results found.  Procedures Procedures (including critical care time)  Medications Ordered in UC Medications - No data to display  Initial Impression / Assessment and Plan / UC Course  I have reviewed the triage vital signs and the nursing notes.  Pertinent labs & imaging results that were available during my care of the patient were reviewed by me and considered in my medical decision making (see chart for details).     New to me with changed cough.  Will send in Tessalon to help with her dry cough.  We discussed how to use along, potential side effects and precautions.  I stressed the importance of her tracking her symptoms to ensure that she is continuing to improve.  We also discussed the importance of her following up with her primary care provider as she will need a follow-up chest x-ray 30 days from initial which we discussed. She is certainly welcome to return as needed for follow up.  Final Clinical Impressions(s) / UC Diagnoses   Final diagnoses:  Pneumonia due to COVID-19 virus  Cough   Discharge Instructions   None    ED Prescriptions    Medication Sig Dispense Auth. Provider   benzonatate (TESSALON) 100 MG capsule Take 1 capsule (100 mg total) by mouth every 8 (eight) hours. 21 capsule Rushie Chestnut, [place]     PDMP not reviewed this encounter.   Rushie Chestnut, [place] 09/04/20 [phone]

## 2020-09-30 ENCOUNTER — Ambulatory Visit: Payer: Self-pay | Admitting: Medical

## 2020-11-09 IMAGING — MG DIGITAL SCREENING BILAT W/ TOMO W/ CAD
8 series · 9 of 24 positions shown · non-contrast
Comparison: None.

CLINICAL DATA: Screening.

EXAM:
DIGITAL SCREENING BILATERAL MAMMOGRAM WITH TOMO AND CAD

[R MLO synth-2D]
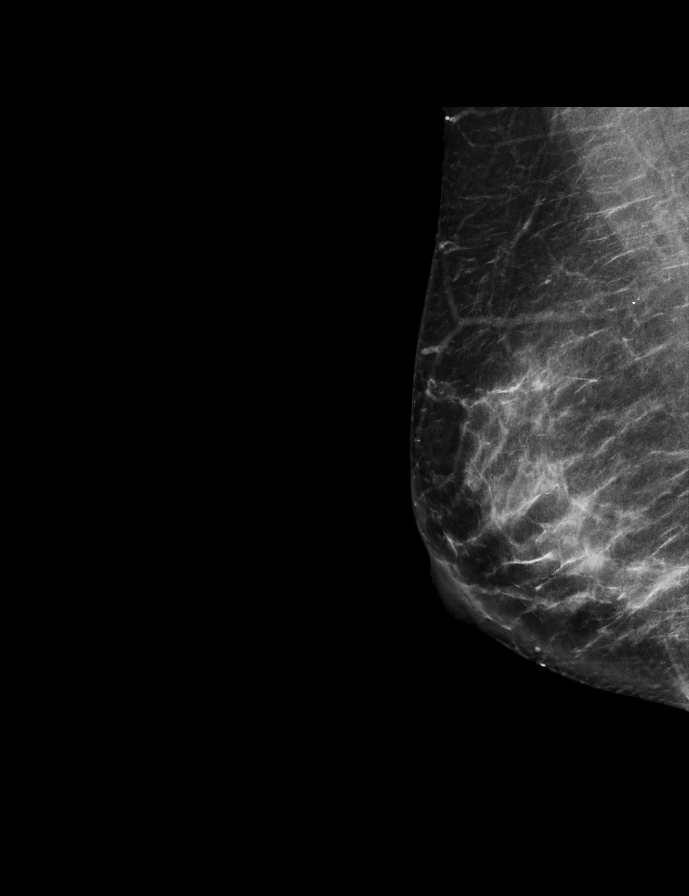

[L CC synth-2D]
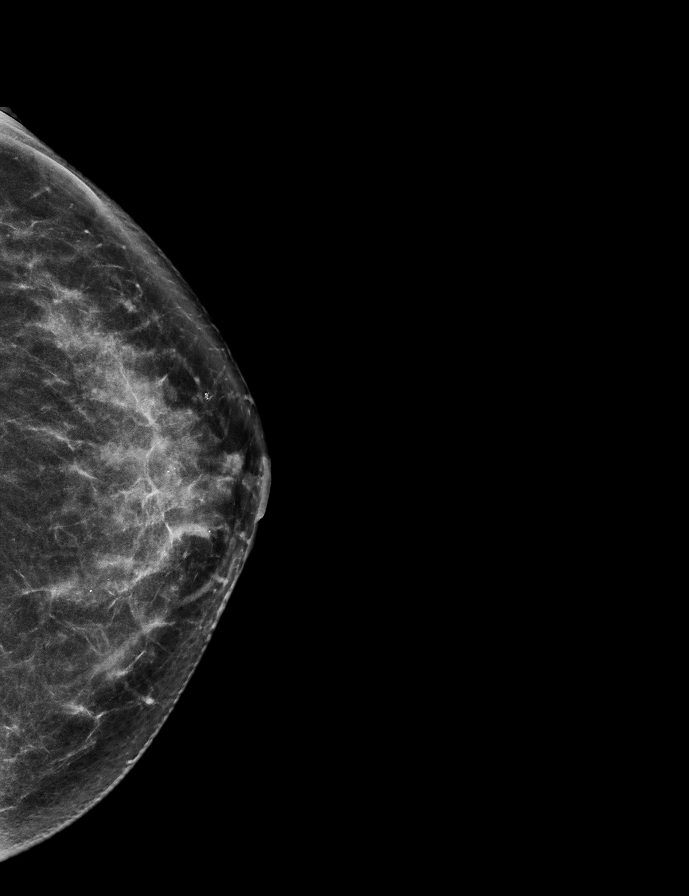

[R CC synth-2D]
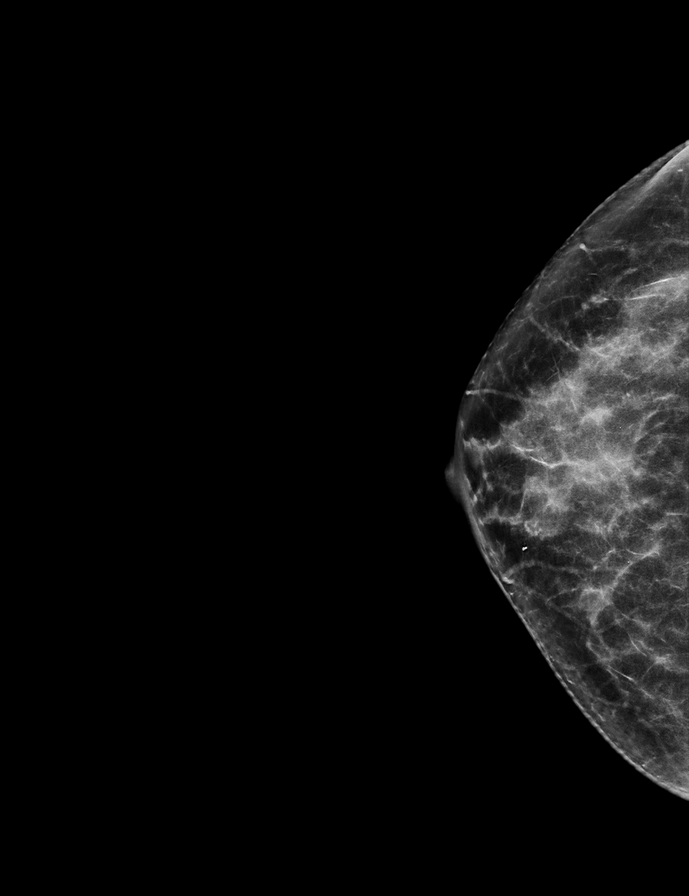

[L MLO synth-2D]
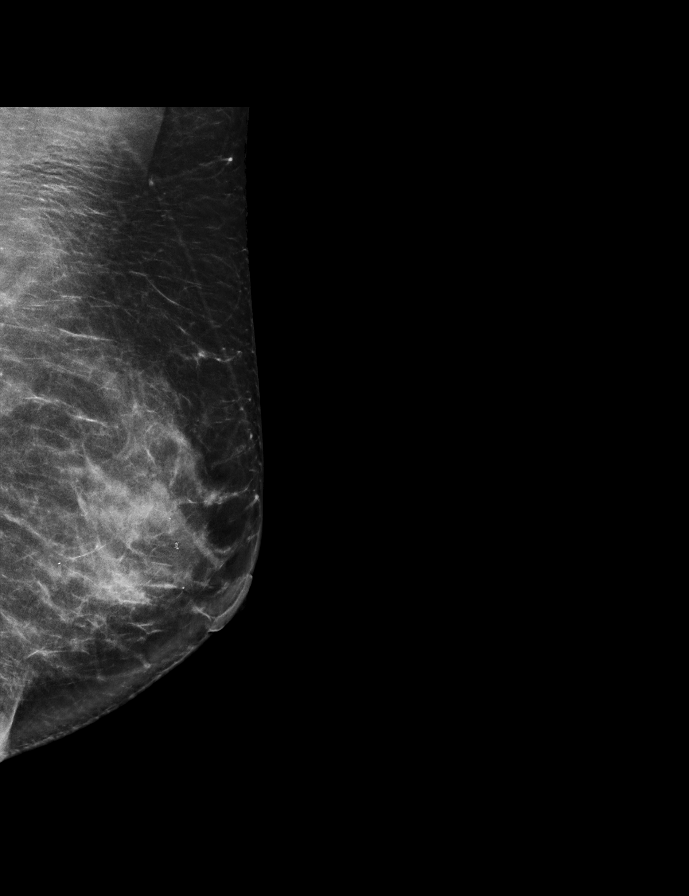

[L MLO tomo · 2 of 72 frames shown]
[frame 24/72]
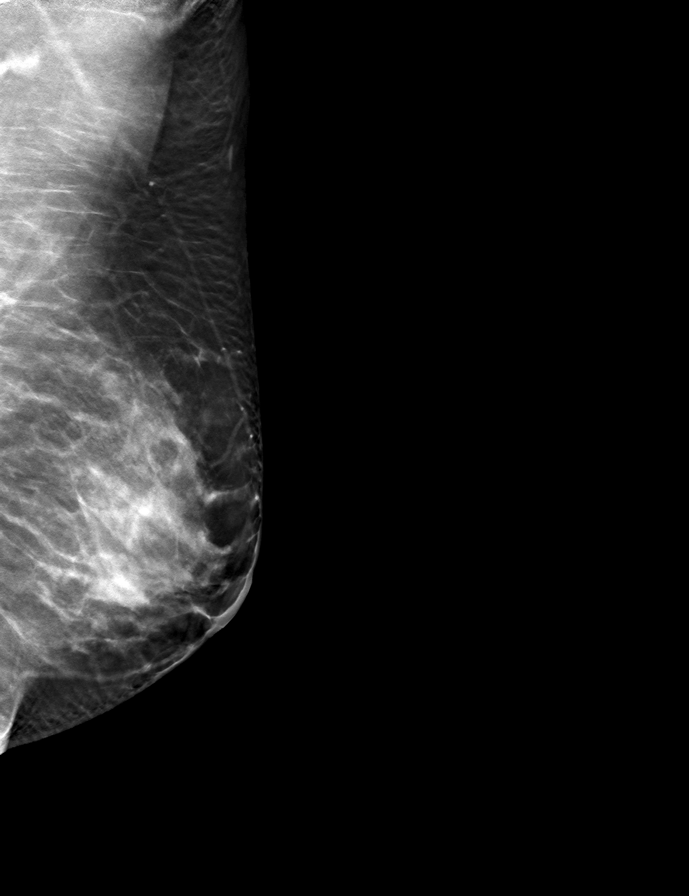
[frame 37/72]
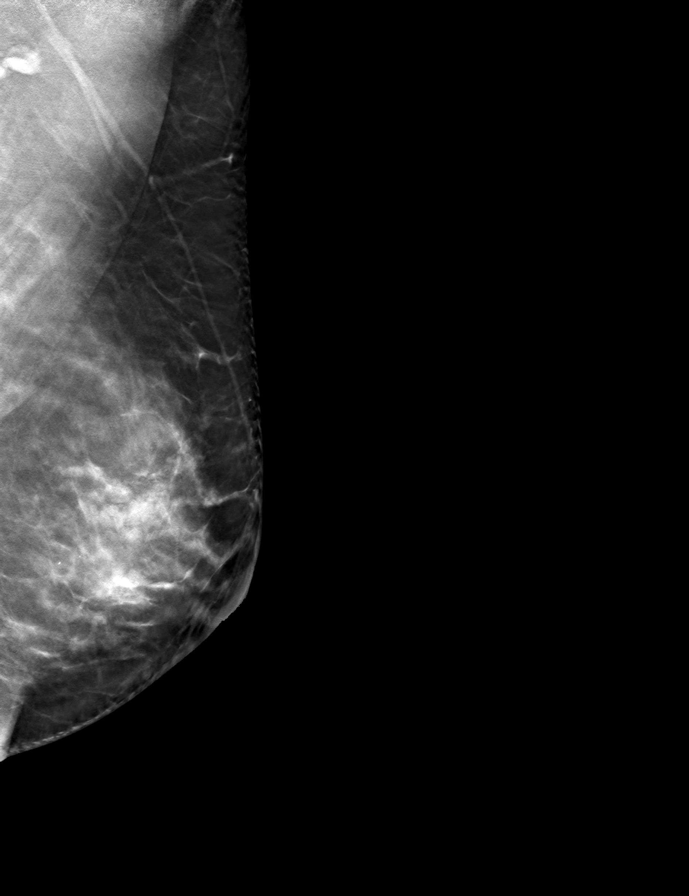

[R CC tomo · tomo slice 31/60.0]
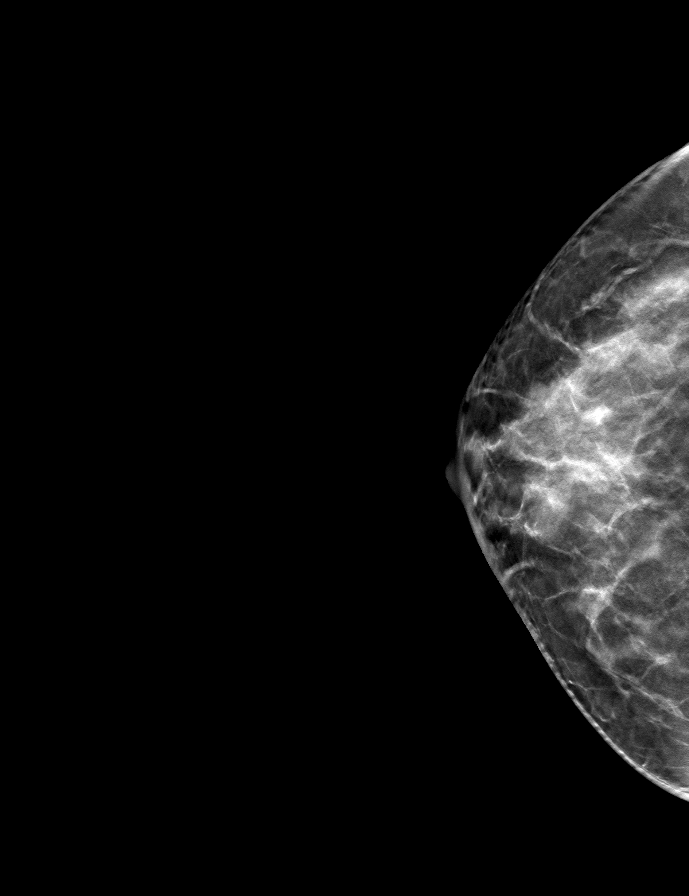

[R MLO tomo · tomo slice 35/68.0]
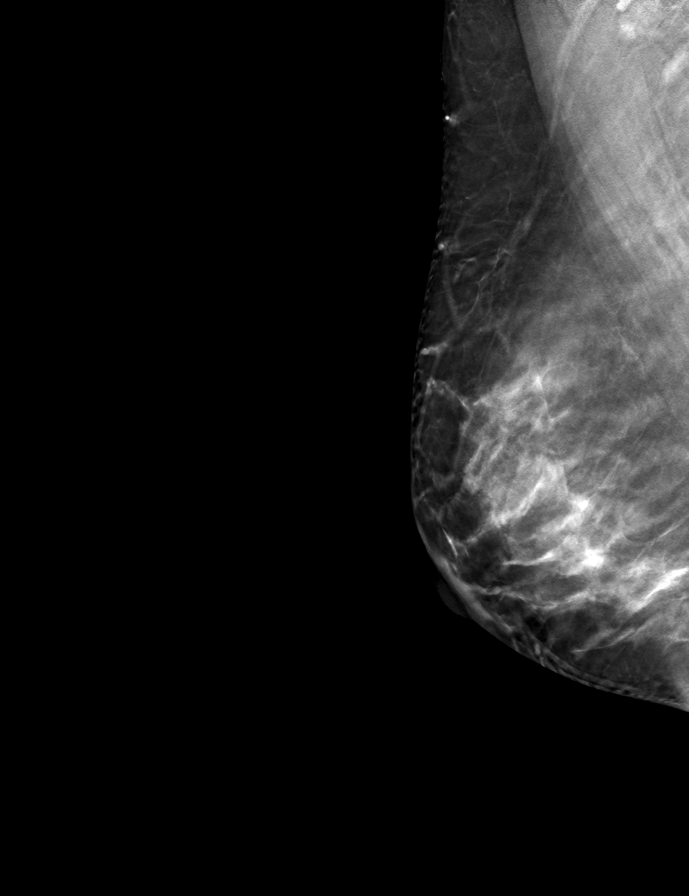

[L CC tomo · tomo slice 33/66.0]
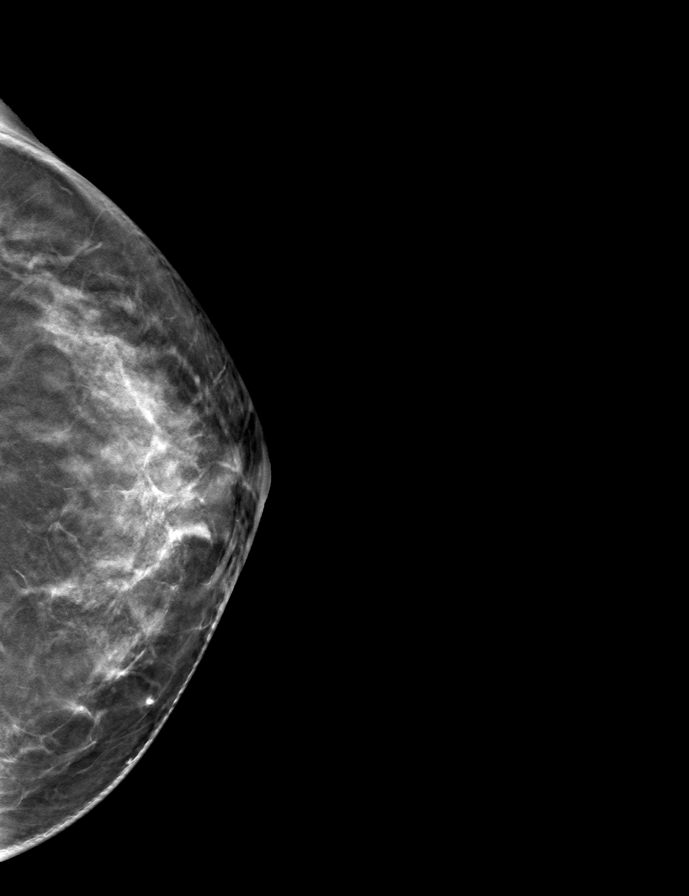

[9 of 24 positions shown; findings below may reference images not displayed]

ACR Breast Density Category c: The breast tissue is heterogeneously
dense, which may obscure small masses
FINDINGS: There are no findings suspicious for malignancy. Images were
processed with CAD.
IMPRESSION: No mammographic evidence of malignancy. A result letter of this
screening mammogram will be mailed directly to the patient.

RECOMMENDATION:
Screening mammogram in one year. (Code:EM-2-IHY)

BI-RADS CATEGORY  1: Negative.

## 2021-04-20 ENCOUNTER — Other Ambulatory Visit: Payer: Self-pay

## 2021-04-20 ENCOUNTER — Encounter: Payer: Self-pay | Admitting: Obstetrics & Gynecology

## 2021-04-20 ENCOUNTER — Ambulatory Visit (INDEPENDENT_AMBULATORY_CARE_PROVIDER_SITE_OTHER): Payer: BC Managed Care – PPO | Admitting: Obstetrics & Gynecology

## 2021-04-20 VITALS — BP 104/64 | HR 84 | Resp 16 | Ht 64.5 in | Wt 142.0 lb

## 2021-04-20 DIAGNOSIS — Z01419 Encounter for gynecological examination (general) (routine) without abnormal findings: Secondary | ICD-10-CM

## 2021-04-20 DIAGNOSIS — Z9189 Other specified personal risk factors, not elsewhere classified: Secondary | ICD-10-CM | POA: Diagnosis not present

## 2021-04-20 NOTE — Progress Notes (Signed)
Melissa Newman March 05, 1976 [id number]   History:    45 y.o. G1P1L1  Married.  Vasectomy.  Son is 18 yo.   RP:  Established patient presenting for annual gyn exam    HPI: Normal menstrual periods every month.  No breakthrough bleeding.  No pelvic pain.  No pain with intercourse.  Pap Neg in 2020.  Husband with vasectomy.  Breasts normal.  Screening mammo Neg 05/2020.  Body mass index 24.  Good fitness/Healthier diet with Veggies/fruits/fish. F/U here for Fasting Health labs.   Past medical history,surgical history, family history and social history were all reviewed and documented in the EPIC chart.  Gynecologic History Patient's last menstrual period was 04/13/2021 (exact date).  Obstetric History OB History  Gravida Para Term Preterm AB Living  _0 SAB IAB Ectopic Multiple Live Births               # Outcome Date GA Lbr Len/2nd Weight Sex Delivery Anes PTL Lv  1 Para              ROS: A ROS was performed and pertinent positives and negatives are included in the history.  GENERAL: No fevers or chills. HEENT: No change in vision, no earache, sore throat or sinus congestion. NECK: No pain or stiffness. CARDIOVASCULAR: No chest pain or pressure. No palpitations. PULMONARY: No shortness of breath, cough or wheeze. GASTROINTESTINAL: No abdominal pain, nausea, vomiting or diarrhea, melena or bright red blood per rectum. GENITOURINARY: No urinary frequency, urgency, hesitancy or dysuria. MUSCULOSKELETAL: No joint or muscle pain, no back pain, no recent trauma. DERMATOLOGIC: No rash, no itching, no lesions. ENDOCRINE: No polyuria, polydipsia, no heat or cold intolerance. No recent change in weight. HEMATOLOGICAL: No anemia or easy bruising or bleeding. NEUROLOGIC: No headache, seizures, numbness, tingling or weakness. PSYCHIATRIC: No depression, no loss of interest in normal activity or change in sleep pattern.     Exam:   BP 104/64   Pulse 84   Resp 16   Ht 5' 4.5" (1.638 m)    Wt 142 lb (64.4 kg)   LMP 04/13/2021 (Exact Date)   BMI 24.00 kg/m   Body mass index is 24 kg/m.  General appearance : Well developed well nourished female. No acute distress HEENT: Eyes: no retinal hemorrhage or exudates,  Neck supple, trachea midline, no carotid bruits, no thyroidmegaly Lungs: Clear to auscultation, no rhonchi or wheezes, or rib retractions  Heart: Regular rate and rhythm, no murmurs or gallops Breast:Examined in sitting and supine position were symmetrical in appearance, no palpable masses or tenderness,  no skin retraction, no nipple inversion, no nipple discharge, no skin discoloration, no axillary or supraclavicular lymphadenopathy Abdomen: no palpable masses or tenderness, no rebound or guarding Extremities: no edema or skin discoloration or tenderness  Pelvic: Vulva: Normal             Vagina: No gross lesions or discharge  Cervix: No gross lesions or discharge  Uterus  AV, normal size, shape and consistency, non-tender and mobile  Adnexa  Without masses or tenderness  Anus: Normal   Assessment/Plan:  45 y.o. female for annual exam   1. Well female exam with routine gynecological exam Normal gynecologic exam.  Pap Neg in 2020, will repeat at 4 yrs next year.  Breasts normal.  Screening mammo Neg in 05/2020.  Good BMI at 24.  Continue with fitness and healthy nutrition.  F/U here for Fasting health labs. -  CBC; Future - Comp Met (CMET); Future - TSH; Future - Lipid Profile; Future - Vitamin D 1,25 dihydroxy; Future  2. Relies on partner vasectomy for contraception   Princess Bruins MD, 10:45 AM 04/20/2021

## 2021-04-27 ENCOUNTER — Other Ambulatory Visit: Payer: Self-pay

## 2021-04-27 ENCOUNTER — Other Ambulatory Visit: Payer: BC Managed Care – PPO

## 2021-04-27 DIAGNOSIS — Z01419 Encounter for gynecological examination (general) (routine) without abnormal findings: Secondary | ICD-10-CM | POA: Diagnosis not present

## 2021-05-01 LAB — VITAMIN D 1,25 DIHYDROXY
Vitamin D 1, 25 (OH)2 Total: 69 pg/mL (ref 18–72)
Vitamin D2 1, 25 (OH)2: <8 pg/mL
Vitamin D3 1, 25 (OH)2: 69 pg/mL

## 2021-05-01 LAB — COMPREHENSIVE METABOLIC PANEL
AG Ratio: 1.3 (calc) (ref 1.0–2.5)
ALT: 10 U/L (ref 6–29)
AST: 14 U/L (ref 10–30)
Albumin: 4 g/dL (ref 3.6–5.1)
Alkaline phosphatase (APISO): 75 U/L (ref 31–125)
BUN/Creatinine Ratio: 17 (calc) (ref 6–22)
BUN: 7 mg/dL (ref 7–25)
CO2: 27 mmol/L (ref 20–32)
Calcium: 8.8 mg/dL (ref 8.6–10.2)
Chloride: 104 mmol/L (ref 98–110)
Creat: 0.42 mg/dL — ABNORMAL LOW (ref 0.50–0.99)
Globulin: 3 g/dL (calc) (ref 1.9–3.7)
Glucose, Bld: 83 mg/dL (ref 65–99)
Potassium: 3.8 mmol/L (ref 3.5–5.3)
Sodium: 138 mmol/L (ref 135–146)
Total Bilirubin: 0.6 mg/dL (ref 0.2–1.2)
Total Protein: 7 g/dL (ref 6.1–8.1)

## 2021-05-01 LAB — TSH: TSH: 1.35 mIU/L

## 2021-05-01 LAB — LIPID PANEL
Cholesterol: 124 mg/dL (ref <200)
HDL: 42 mg/dL — ABNORMAL LOW (ref >=50)
LDL Cholesterol (Calc): 68 mg/dL (calc)
Non-HDL Cholesterol (Calc): 82 mg/dL (calc) (ref <130)
Total CHOL/HDL Ratio: 3 (calc) (ref <5.0)
Triglycerides: 56 mg/dL (ref <150)

## 2021-05-01 LAB — CBC
HCT: 39.6 % (ref 35.0–45.0)
Hemoglobin: 13 g/dL (ref 11.7–15.5)
MCH: 29.4 pg (ref 27.0–33.0)
MCHC: 32.8 g/dL (ref 32.0–36.0)
MCV: 89.6 fL (ref 80.0–100.0)
MPV: 9.4 fL (ref 7.5–12.5)
Platelets: 316 10*3/uL (ref 140–400)
RBC: 4.42 10*6/uL (ref 3.80–5.10)
RDW: 11.7 % (ref 11.0–15.0)
WBC: 6.9 10*3/uL (ref 3.8–10.8)

## 2021-07-27 DIAGNOSIS — Z1329 Encounter for screening for other suspected endocrine disorder: Secondary | ICD-10-CM | POA: Diagnosis not present

## 2021-07-27 DIAGNOSIS — E559 Vitamin D deficiency, unspecified: Secondary | ICD-10-CM | POA: Diagnosis not present

## 2021-07-27 DIAGNOSIS — E538 Deficiency of other specified B group vitamins: Secondary | ICD-10-CM | POA: Diagnosis not present

## 2021-07-27 DIAGNOSIS — R7989 Other specified abnormal findings of blood chemistry: Secondary | ICD-10-CM | POA: Diagnosis not present

## 2021-07-27 DIAGNOSIS — R790 Abnormal level of blood mineral: Secondary | ICD-10-CM | POA: Diagnosis not present

## 2021-07-27 DIAGNOSIS — R5383 Other fatigue: Secondary | ICD-10-CM | POA: Diagnosis not present

## 2021-08-24 DIAGNOSIS — E538 Deficiency of other specified B group vitamins: Secondary | ICD-10-CM | POA: Diagnosis not present

## 2021-08-24 DIAGNOSIS — Z8639 Personal history of other endocrine, nutritional and metabolic disease: Secondary | ICD-10-CM | POA: Diagnosis not present

## 2021-08-24 DIAGNOSIS — R7989 Other specified abnormal findings of blood chemistry: Secondary | ICD-10-CM | POA: Diagnosis not present

## 2021-08-24 DIAGNOSIS — M069 Rheumatoid arthritis, unspecified: Secondary | ICD-10-CM | POA: Diagnosis not present

## 2021-09-03 DIAGNOSIS — E538 Deficiency of other specified B group vitamins: Secondary | ICD-10-CM | POA: Diagnosis not present

## 2021-09-03 DIAGNOSIS — Z8639 Personal history of other endocrine, nutritional and metabolic disease: Secondary | ICD-10-CM | POA: Diagnosis not present

## 2021-09-03 DIAGNOSIS — R7989 Other specified abnormal findings of blood chemistry: Secondary | ICD-10-CM | POA: Diagnosis not present

## 2021-09-03 DIAGNOSIS — M069 Rheumatoid arthritis, unspecified: Secondary | ICD-10-CM | POA: Diagnosis not present

## 2022-01-11 DIAGNOSIS — R7989 Other specified abnormal findings of blood chemistry: Secondary | ICD-10-CM | POA: Diagnosis not present

## 2022-01-11 DIAGNOSIS — E559 Vitamin D deficiency, unspecified: Secondary | ICD-10-CM | POA: Diagnosis not present

## 2022-01-11 DIAGNOSIS — R5383 Other fatigue: Secondary | ICD-10-CM | POA: Diagnosis not present

## 2022-01-11 DIAGNOSIS — R790 Abnormal level of blood mineral: Secondary | ICD-10-CM | POA: Diagnosis not present

## 2022-01-11 DIAGNOSIS — E538 Deficiency of other specified B group vitamins: Secondary | ICD-10-CM | POA: Diagnosis not present

## 2022-01-11 DIAGNOSIS — Z8639 Personal history of other endocrine, nutritional and metabolic disease: Secondary | ICD-10-CM | POA: Diagnosis not present

## 2022-01-11 DIAGNOSIS — Z1329 Encounter for screening for other suspected endocrine disorder: Secondary | ICD-10-CM | POA: Diagnosis not present

## 2022-01-11 DIAGNOSIS — Z7409 Other reduced mobility: Secondary | ICD-10-CM | POA: Diagnosis not present

## 2022-01-11 DIAGNOSIS — M79671 Pain in right foot: Secondary | ICD-10-CM | POA: Diagnosis not present

## 2022-04-29 DIAGNOSIS — E079 Disorder of thyroid, unspecified: Secondary | ICD-10-CM | POA: Diagnosis not present

## 2022-04-29 DIAGNOSIS — R5383 Other fatigue: Secondary | ICD-10-CM | POA: Diagnosis not present

## 2022-04-29 DIAGNOSIS — B2790 Infectious mononucleosis, unspecified without complication: Secondary | ICD-10-CM | POA: Diagnosis not present

## 2022-04-29 DIAGNOSIS — E063 Autoimmune thyroiditis: Secondary | ICD-10-CM | POA: Diagnosis not present

## 2022-04-29 DIAGNOSIS — E538 Deficiency of other specified B group vitamins: Secondary | ICD-10-CM | POA: Diagnosis not present

## 2022-05-09 DIAGNOSIS — R6882 Decreased libido: Secondary | ICD-10-CM | POA: Diagnosis not present

## 2022-05-09 DIAGNOSIS — R5383 Other fatigue: Secondary | ICD-10-CM | POA: Diagnosis not present

## 2022-05-09 DIAGNOSIS — M2550 Pain in unspecified joint: Secondary | ICD-10-CM | POA: Diagnosis not present

## 2022-05-09 DIAGNOSIS — E612 Magnesium deficiency: Secondary | ICD-10-CM | POA: Diagnosis not present
